# Patient Record
Sex: Male | Born: 1960 | Race: White | Hispanic: No | Marital: Married | Smoking: Never smoker
Health system: Southern US, Community
[De-identification: ages and names within clinical notes are randomized; demographics above are authoritative.]

## PROBLEM LIST (undated history)

## (undated) DIAGNOSIS — N393 Stress incontinence (female) (male): Secondary | ICD-10-CM

## (undated) DIAGNOSIS — R200 Anesthesia of skin: Secondary | ICD-10-CM

## (undated) DIAGNOSIS — I10 Essential (primary) hypertension: Secondary | ICD-10-CM

## (undated) DIAGNOSIS — E785 Hyperlipidemia, unspecified: Secondary | ICD-10-CM

## (undated) DIAGNOSIS — G479 Sleep disorder, unspecified: Secondary | ICD-10-CM

## (undated) DIAGNOSIS — M21379 Foot drop, unspecified foot: Secondary | ICD-10-CM

## (undated) DIAGNOSIS — G709 Myoneural disorder, unspecified: Secondary | ICD-10-CM

## (undated) DIAGNOSIS — R2689 Other abnormalities of gait and mobility: Secondary | ICD-10-CM

## (undated) HISTORY — PX: TIBIA FRACTURE SURGERY: SHX806

## (undated) HISTORY — DX: Essential (primary) hypertension: I10

## (undated) HISTORY — PX: VENA CAVA FILTER PLACEMENT: SUR1032

## (undated) HISTORY — DX: Hyperlipidemia, unspecified: E78.5

## (undated) HISTORY — PX: SPINE SURGERY: SHX786

## (undated) HISTORY — PX: COLONOSCOPY: SHX174

---

## 1999-02-18 ENCOUNTER — Ambulatory Visit (HOSPITAL_COMMUNITY): Admission: RE | Admit: 1999-02-18 | Discharge: 1999-02-18 | Payer: Self-pay | Admitting: *Deleted

## 1999-02-18 ENCOUNTER — Encounter: Payer: Self-pay | Admitting: *Deleted

## 2010-02-01 ENCOUNTER — Inpatient Hospital Stay (HOSPITAL_COMMUNITY)
Admission: AC | Admit: 2010-02-01 | Discharge: 2010-02-05 | Disposition: A | Discharge status: Rehabilitation Facility | Payer: Self-pay | Source: Home / Self Care

## 2010-02-05 ENCOUNTER — Inpatient Hospital Stay (HOSPITAL_COMMUNITY)
Admission: RE | Admit: 2010-02-05 | Discharge: 2010-02-20 | Payer: Self-pay | Attending: Physical Medicine & Rehabilitation | Admitting: Physical Medicine & Rehabilitation

## 2010-02-28 ENCOUNTER — Encounter
Admission: RE | Admit: 2010-02-28 | Discharge: 2010-03-20 | Payer: Self-pay | Source: Home / Self Care | Attending: Physical Medicine & Rehabilitation | Admitting: Physical Medicine & Rehabilitation

## 2010-03-09 NOTE — Discharge Summary (Signed)
Albert Nunez, Albert Nunez NO.:  [id number]  MEDICAL RECORD NO.:  [id number]          Nunez TYPE:  IPS  LOCATION:  4007                         FACILITY:  [facility]  PHYSICIAN:  Ranelle Oyster, M.D.DATE OF BIRTH:  [birth year]  DATE OF ADMISSION:  02/05/2010 DATE OF DISCHARGE:  02/20/2010                              DISCHARGE SUMMARY   DISCHARGE DIAGNOSES: 1. Cauda equina syndrome due to spinal cord injury with severe L1     fracture and anterolisthesis of T12 and L1 with canal compromise. 2. Hypotension. 3. Urinary retention, resolved.  HISTORY OF PRESENT ILLNESS:  Albert Nunez is a 49 year old male who fell approximately one-story while working at work on February 01, 2010, with brief loss of consciousness and amnesia of events.  Albert Nunez with complaints of back pain and tingling of toes.  Workup revealed mild compression fracture at T12, severe L1 fracture with anterolisthesis of T12 and L1 with canal compromise and disruption of posterior elements with widening between T12 and L1.  Albert Nunez underwent T12-L1 decompression with evacuation of epidural hematoma as well as T12-L1 open reduction with arthrodesis T9-L3 on Albert same day by Dr. Jordan Likes.  An IVC filter was placed for DVT prophylaxis on February 04, 2010.  Albert Nunez has had issues with hypertension as well as bradycardia with activity.  CT of chest on February 03, 2010, showed no evidence of PE and partial collapse bilateral lower lobes due to atelectasis.  Hemovac was discontinued on January 25, 2010.  Therapies were initiated and Albert Nunez with increase in left lower extremity instability with standing. Albert Nunez was evaluated by rehab and felt that she would benefit from a CIR program.  PAST MEDICAL HISTORY:  Significant for left tibial rodding for fracture 15 years ago as well as hypertension.  REVIEW OF SYMPTOMS:  Positive for wound care issues, weakness, numbness in bilateral lower  extremities.  ALLERGIES:  No known drug allergies.  FAMILY HISTORY:  Positive for cancer.  SOCIAL HISTORY:  Albert Nunez is married, lives independent, and self employed prior to admission.  Lives in two-level home with 3-4 steps at entry.  Bedroom on first level.  12-13 steps to second level.  Does not use any tobacco or alcohol.  Two small children at home.  Wife is an Chief Financial Officer stewardess and multiple supportive family members to assist past discharge.  FUNCTIONAL HISTORY:  Albert Nunez was independent and driving prior to admission.  FUNCTIONAL STATUS:  Albert Nunez is plus 2 total assist 50% to pivot to chair, has been able to stand 1 minute at +2 total assist 70%.  Albert Nunez is min assist for upper body care, total assist for lower body care.  PHYSICAL EXAMINATION:  VITALS:  Blood pressure 122/73, pulse 61, respirations 21, temperature 97.8. GENERAL:  Albert Nunez is well-nourished, well-developed male, alert and oriented x3 in no acute distress. HEENT:  Eyes anicteric, noninjected.  Oral mucosa is pink and moist. Nares patent. NECK:  Supple without JVD or lymphadenopathy. LUNGS:  Clear to auscultation bilaterally with good respiratory effort. SKIN:  Back incision shows healing midline incision from thoracic to lumbar area.  ABDOMEN:  Soft, nontender with positive bowel sounds.  Steri-Strips in place. NEUROLOGIC:  Albert Nunez is alert and oriented x3.  Decreased sensation below Albert hips, but his body fashion.  Albert Nunez is able to feel right L5, decrease in left S1, decrease in right S1.  Albert Nunez also with decreased sensation in scrotal and penile area as well as in buttocks. Albert Nunez with decreased rectal tone.  Able to sense pressure.  Motor strength is 5/5 bilateral deltoids, biceps, grips.  Right hip flexion is 2-/5, knee extension is 3-/5, ankle dorsiflexion, plantar flexion at 3- /5.  On left side, hip flexion is 2-/5, knee extension 3-/5, ankle dorsiflexion, plantar  flexion 0/5.   HOSPITAL COURSE:  Albert Nunez was admitted to rehab on February 05, 2010, for inpatient therapies to consist of PT, OT at least 3 hours 5 days a week.  Past admission physiatrist, rehab RN, and therapy team have worked together to provide customized collaborative interdisciplinary care.  His Foley had been discontinued prior to admission.  Voiding was monitored with PVR checks.  Albert Nunez was noted to have issues with urinary retention and no voiding attempts with bladder volumes at 300-700 mL.  Due to his neurogenic bladder, Foley was replaced.  Albert Nunez was also noted to have issues with constipation. Albert Nunez was started on bowel program by nursing.  KUB was done on February 09, 2010, showing constipation with feces throughout Albert colon. Extensive laxatives were used initially to help with initiation of bowel program.  Albert Nunez also with issues regarding orthostasis with attempts on therapy at 12:20 a.m.  An abdominal binder as well as thigh- high TEDs were ordered when out of bed.  By Albert time of discharge, blood pressures have stabilized out and Albert Nunez without need of binder.Apparently knee-high TEDs are being used to help with some edema control in his lower extremities.  Labs were done past admission showing Albert Nunez with acute blood loss anemia with H and H at 10.8 and 31.9, white count 8.3, platelets 210.  Check of lytes revealed sodium 140, potassium 4.1, chloride 105, CO2 of 27, BUN 22, creatinine 0.75, glucose 116.  Albert Nunez continued to have issues with urinary retention.  Albert Nunez was started on Flomax 0.4 mg at bedtime.  Low-dose Urecholine was added and increased to 25 mg p.o. t.i.d.  With Albert increase in Albert Urecholine dose, Albert Nunez was able to start voiding without evidence of retention.  Albert Nunez is being toileted q.4 h. and as of February 14, 2010, Albert Nunez is voiding with PVRs at 0 mL.  Initially Albert Nunez required Vicodin for  pain management.  Due to issues with severe constipation, Albert Nunez has backed off on this and p.r.n. Ultram has been effective currently.  Bowel program was initiated and has been effective with good results.  Currently, Albert Nunez is requiring suppository with dig stem and Albert Nunez as well as wife are able to do this without difficulty. Labs done past admission reveal hemoglobin 10.8, hematocrit 31.9, WBC 8.3, platlets 210.  Check of lytes revealed sodium 140, potassium 4.1, chloride 105, CO2 27, glucsoe 116, BUN 22, creatinine 0.95.  Liver function tests revealed AST 39, ALT 39, alkaline phos 34, total bilirubin 0.6, albumin 2.8.   During Albert Nunez's stay in rehab, weekly team conferences were held to monitor Albert Nunez's progress, set goals, as well as discuss barriers to discharge.  Extensive education has been done with Albert Nunez and wife regarding bowel  and bladder program and need to have it scheduled to help with voiding as well as to prevent constipation issues.  At admission, Albert Nunez was noted to have decreased tolerance to being upright due to hypertensive episodes.  Albert Nunez was also noted to have decrease in sitting and standing balance as well as lower extremity weakness with decreased sensation and proprioception throughout his lower extremities.  Albert Nunez required mod to max assist for bed mobility and for sitting at Albert edge of Albert bed.  Albert Nunez was able to perform sit-to-stand transfer as mod-to-max assist and able to stand for 45 seconds.  Albert Nunez could propel wheelchair with upper extremities with supervision for 120 feet.  Albert Nunez has had improvement in lower extremity strength.  Albert Nunez continues with left AFO and a solid upright AFO was ordered for left lower extremity.  Currently, Albert Nunez is at supervision level for bed to chair transfers, car transfers, as well as wheelchair transfers.  Albert Nunez was able to propel his wheelchair independently.  Albert Nunez has been able to ambulate up to  160 feet with min assist over low obstacles.  Albert Nunez is able to navigate 4 steps with use of bilateral rails with min assist as well as vocational close supervision for steadying.  OT has worked with Albert Nunez on self-care tasks. Initially Albert Nunez at total assist for BADLs.  Wife as well as main caregiver have been present during most therapy sessions.  Albert Nunez has worked extensively to help improve with activity tolerance as well as upper extremity strengthening.  Albert Nunez to continue using Adaptic equipment for lower extremities to help improve independence in self- care tasks.  Currently, Albert Nunez is min assist for bathing due to requiring assist for perineal care while adhering to back precautions. Albert Nunez is set up with supervision for upper body dressing due to his inability to consistently adhere to back precautions for donning shirt. Albert Nunez is min assist for lower body dressing especially to don his AFO.  Albert Nunez also requires assist to don and tie right shoe.  Albert Nunez is to have assistance from wife and sister for his ADL tasks.  Further followup outpatient PT, OT have been set up at [facility] to begin on February 28, 2010. Education has been done with family regarding current  progress, anticipated recovery and long term needs. On February 20, 2010, Albert Nunez is discharged to home.  DISCHARGE MEDICATIONS: 1. Urecholine 25 mg p.o. t.i.d. at 7 a.m., 11:30, and 5:30 p.m. 2. Dulcolax suppository one per rectum q.a.m. 3. Tylenol 325/650 mg p.o. q.4 h. p.r.n. pain. 4. Os-Cal plus D p.o. t.i.d. 5. Robaxin 500 mg p.o. q.6 h. p.r.n. spasm. 6. Naprosyn 250 mg p.o. q.12 h. p.r.n. pain. 7. Senokot-S two p.o. at noon as well as at bedtime. 8. Ultram 50 mg p.o. q.6 h. p.r.n. moderate-to-severe pain. 9. Fenofibrate 160 mg p.o. per day.  DIET:  Regular.  ACTIVITY LEVEL:  A 24-hour supervision for mobility.  No strenuous activity.  SPECIAL INSTRUCTIONS:  No alcohol, no  smoking, and no driving.  Weight TLSO at edge of bed when out of bed.  [facility] outpatient rehab to include PT/OT to begin February 28, 2009, at 8:30 to 10:30 a.m.  Do not use amlodipine for now.  FOLLOWUP:  Albert Nunez to follow up with Dr. Riley Kill on March 23, 2010, at 12 noon.  Follow up with Dr. Julio Sicks in 2-3 weeks for postop check.  Follow up with Dr. Onalee Hua  Swayne for routine check in 4 weeks.     Delle Reining, P.A.   ______________________________ Ranelle Oyster, M.D.    PL/MEDQ  D:  02/20/2010  T:  02/21/2010  Job:  [id number]  cc:   Henry A. Pool, M.D. Tally Joe, M.D.  Electronically Signed by Osvaldo Shipper. on 02/21/2010 03:23:17 PM Electronically Signed by Faith Rogue M.D. on 03/09/2010 09:53:21 AM

## 2010-03-14 ENCOUNTER — Encounter
Admission: RE | Admit: 2010-03-14 | Discharge: 2010-03-14 | Payer: Self-pay | Source: Home / Self Care | Attending: Neurosurgery | Admitting: Neurosurgery

## 2010-03-22 ENCOUNTER — Ambulatory Visit: Payer: BC Managed Care – HMO | Attending: Orthopedic Surgery | Admitting: Physical Therapy

## 2010-03-22 ENCOUNTER — Ambulatory Visit: Payer: BC Managed Care – HMO | Admitting: Occupational Therapy

## 2010-03-22 DIAGNOSIS — G834 Cauda equina syndrome: Secondary | ICD-10-CM | POA: Insufficient documentation

## 2010-03-22 DIAGNOSIS — Z5189 Encounter for other specified aftercare: Secondary | ICD-10-CM | POA: Insufficient documentation

## 2010-03-22 DIAGNOSIS — R269 Unspecified abnormalities of gait and mobility: Secondary | ICD-10-CM | POA: Insufficient documentation

## 2010-03-22 DIAGNOSIS — M6281 Muscle weakness (generalized): Secondary | ICD-10-CM | POA: Insufficient documentation

## 2010-03-22 DIAGNOSIS — R5381 Other malaise: Secondary | ICD-10-CM | POA: Insufficient documentation

## 2010-03-23 ENCOUNTER — Ambulatory Visit: Payer: BC Managed Care – HMO | Attending: Physical Medicine & Rehabilitation

## 2010-03-23 ENCOUNTER — Ambulatory Visit: Admit: 2010-03-23 | Payer: Self-pay | Admitting: Physical Medicine & Rehabilitation

## 2010-03-23 ENCOUNTER — Inpatient Hospital Stay (HOSPITAL_BASED_OUTPATIENT_CLINIC_OR_DEPARTMENT_OTHER): Payer: BC Managed Care – HMO | Admitting: Physical Medicine & Rehabilitation

## 2010-03-23 ENCOUNTER — Ambulatory Visit: Payer: BC Managed Care – HMO

## 2010-03-23 DIAGNOSIS — Q762 Congenital spondylolisthesis: Secondary | ICD-10-CM | POA: Insufficient documentation

## 2010-03-23 DIAGNOSIS — IMO0002 Reserved for concepts with insufficient information to code with codable children: Secondary | ICD-10-CM | POA: Insufficient documentation

## 2010-03-23 DIAGNOSIS — X58XXXS Exposure to other specified factors, sequela: Secondary | ICD-10-CM | POA: Insufficient documentation

## 2010-03-23 DIAGNOSIS — S343XXA Injury of cauda equina, initial encounter: Secondary | ICD-10-CM

## 2010-03-23 DIAGNOSIS — G834 Cauda equina syndrome: Secondary | ICD-10-CM | POA: Insufficient documentation

## 2010-03-23 DIAGNOSIS — N319 Neuromuscular dysfunction of bladder, unspecified: Secondary | ICD-10-CM

## 2010-03-23 DIAGNOSIS — K592 Neurogenic bowel, not elsewhere classified: Secondary | ICD-10-CM

## 2010-03-26 ENCOUNTER — Ambulatory Visit: Payer: BC Managed Care – HMO | Admitting: Physical Therapy

## 2010-03-26 ENCOUNTER — Encounter: Payer: Self-pay | Admitting: Occupational Therapy

## 2010-03-29 ENCOUNTER — Ambulatory Visit: Payer: BC Managed Care – HMO | Admitting: Physical Therapy

## 2010-03-29 ENCOUNTER — Encounter: Payer: Self-pay | Admitting: Occupational Therapy

## 2010-04-02 ENCOUNTER — Ambulatory Visit: Payer: BC Managed Care – HMO | Admitting: Physical Therapy

## 2010-04-02 ENCOUNTER — Encounter: Payer: Self-pay | Admitting: Occupational Therapy

## 2010-04-03 ENCOUNTER — Other Ambulatory Visit: Payer: Self-pay | Admitting: Physical Medicine & Rehabilitation

## 2010-04-03 DIAGNOSIS — O2230 Deep phlebothrombosis in pregnancy, unspecified trimester: Secondary | ICD-10-CM

## 2010-04-04 ENCOUNTER — Encounter: Payer: Self-pay | Admitting: Occupational Therapy

## 2010-04-04 ENCOUNTER — Ambulatory Visit: Payer: BC Managed Care – HMO | Admitting: Physical Therapy

## 2010-04-09 ENCOUNTER — Ambulatory Visit: Payer: Self-pay | Admitting: Physical Therapy

## 2010-04-09 ENCOUNTER — Encounter: Payer: Self-pay | Admitting: Occupational Therapy

## 2010-04-09 ENCOUNTER — Ambulatory Visit: Payer: BC Managed Care – HMO | Admitting: Physical Therapy

## 2010-04-11 ENCOUNTER — Encounter: Payer: Self-pay | Admitting: Occupational Therapy

## 2010-04-11 ENCOUNTER — Ambulatory Visit: Payer: BC Managed Care – HMO | Admitting: Physical Therapy

## 2010-04-13 ENCOUNTER — Ambulatory Visit (HOSPITAL_COMMUNITY)
Admission: RE | Admit: 2010-04-13 | Discharge: 2010-04-13 | Disposition: A | Discharge status: Home / Self Care | Payer: BC Managed Care – HMO | Source: Ambulatory Visit | Attending: Physical Medicine & Rehabilitation | Admitting: Physical Medicine & Rehabilitation

## 2010-04-13 ENCOUNTER — Other Ambulatory Visit: Payer: Self-pay | Admitting: Physical Medicine & Rehabilitation

## 2010-04-13 DIAGNOSIS — X58XXXA Exposure to other specified factors, initial encounter: Secondary | ICD-10-CM | POA: Insufficient documentation

## 2010-04-13 DIAGNOSIS — O2230 Deep phlebothrombosis in pregnancy, unspecified trimester: Secondary | ICD-10-CM

## 2010-04-13 DIAGNOSIS — IMO0002 Reserved for concepts with insufficient information to code with codable children: Secondary | ICD-10-CM | POA: Insufficient documentation

## 2010-04-13 LAB — CBC
Hemoglobin: 12.4 g/dL — ABNORMAL LOW (ref 13.0–17.0)
MCH: 29.7 pg (ref 26.0–34.0)
MCV: 84.4 fL (ref 78.0–100.0)
RBC: 4.18 MIL/uL — ABNORMAL LOW (ref 4.22–5.81)

## 2010-04-13 LAB — POCT I-STAT, CHEM 8
BUN: 16 mg/dL (ref 6–23)
Creatinine, Ser: 0.9 mg/dL (ref 0.4–1.5)
Potassium: 4 mEq/L (ref 3.5–5.1)
Sodium: 142 mEq/L (ref 135–145)

## 2010-04-13 MED ORDER — IOHEXOL 300 MG/ML  SOLN
100.0000 mL | Freq: Once | INTRAMUSCULAR | Status: AC | PRN
  Administered 2010-04-13: 70 mL via INTRAVENOUS

## 2010-04-13 MED ORDER — IOHEXOL 300 MG/ML  SOLN: 150.0000 mL | Freq: Once | INTRAMUSCULAR | Status: AC | PRN

## 2010-04-16 ENCOUNTER — Ambulatory Visit: Payer: BC Managed Care – HMO | Admitting: Physical Therapy

## 2010-04-16 ENCOUNTER — Encounter: Payer: Self-pay | Admitting: Occupational Therapy

## 2010-04-18 ENCOUNTER — Encounter: Payer: Self-pay | Admitting: Occupational Therapy

## 2010-04-18 ENCOUNTER — Ambulatory Visit: Payer: BC Managed Care – HMO | Admitting: Physical Therapy

## 2010-04-24 ENCOUNTER — Ambulatory Visit: Payer: BC Managed Care – HMO | Attending: Orthopedic Surgery | Admitting: Physical Therapy

## 2010-04-24 DIAGNOSIS — R269 Unspecified abnormalities of gait and mobility: Secondary | ICD-10-CM | POA: Insufficient documentation

## 2010-04-24 DIAGNOSIS — G834 Cauda equina syndrome: Secondary | ICD-10-CM | POA: Insufficient documentation

## 2010-04-24 DIAGNOSIS — M6281 Muscle weakness (generalized): Secondary | ICD-10-CM | POA: Insufficient documentation

## 2010-04-24 DIAGNOSIS — Z5189 Encounter for other specified aftercare: Secondary | ICD-10-CM | POA: Insufficient documentation

## 2010-04-24 DIAGNOSIS — R5381 Other malaise: Secondary | ICD-10-CM | POA: Insufficient documentation

## 2010-04-25 ENCOUNTER — Ambulatory Visit
Admission: RE | Admit: 2010-04-25 | Discharge: 2010-04-25 | Disposition: A | Discharge status: Home / Self Care | Payer: BC Managed Care – HMO | Source: Ambulatory Visit | Attending: Neurosurgery | Admitting: Neurosurgery

## 2010-04-25 ENCOUNTER — Other Ambulatory Visit: Payer: Self-pay | Admitting: Neurosurgery

## 2010-04-25 DIAGNOSIS — T148XXA Other injury of unspecified body region, initial encounter: Secondary | ICD-10-CM

## 2010-04-26 ENCOUNTER — Ambulatory Visit: Payer: BC Managed Care – HMO | Admitting: Physical Therapy

## 2010-04-27 ENCOUNTER — Ambulatory Visit (HOSPITAL_BASED_OUTPATIENT_CLINIC_OR_DEPARTMENT_OTHER): Payer: BC Managed Care – HMO | Admitting: Physical Medicine & Rehabilitation

## 2010-04-27 ENCOUNTER — Encounter: Payer: BC Managed Care – HMO | Attending: Physical Medicine & Rehabilitation

## 2010-04-27 DIAGNOSIS — G834 Cauda equina syndrome: Secondary | ICD-10-CM | POA: Insufficient documentation

## 2010-04-27 DIAGNOSIS — K592 Neurogenic bowel, not elsewhere classified: Secondary | ICD-10-CM

## 2010-04-27 DIAGNOSIS — N319 Neuromuscular dysfunction of bladder, unspecified: Secondary | ICD-10-CM

## 2010-04-27 DIAGNOSIS — R29898 Other symptoms and signs involving the musculoskeletal system: Secondary | ICD-10-CM | POA: Insufficient documentation

## 2010-04-27 DIAGNOSIS — S34109A Unspecified injury to unspecified level of lumbar spinal cord, initial encounter: Secondary | ICD-10-CM

## 2010-04-27 DIAGNOSIS — S32008A Other fracture of unspecified lumbar vertebra, initial encounter for closed fracture: Secondary | ICD-10-CM

## 2010-04-30 ENCOUNTER — Ambulatory Visit: Payer: BC Managed Care – HMO | Admitting: Physical Therapy

## 2010-04-30 LAB — POCT I-STAT, CHEM 8
Calcium, Ion: 1.04 mmol/L — ABNORMAL LOW (ref 1.12–1.32)
Glucose, Bld: 112 mg/dL — ABNORMAL HIGH (ref 70–99)
HCT: 38 % — ABNORMAL LOW (ref 39.0–52.0)
Hemoglobin: 12.9 g/dL — ABNORMAL LOW (ref 13.0–17.0)
TCO2: 21 mmol/L (ref 0–100)

## 2010-04-30 LAB — COMPREHENSIVE METABOLIC PANEL
ALT: 38 U/L (ref 0–53)
Albumin: 2.8 g/dL — ABNORMAL LOW (ref 3.5–5.2)
Alkaline Phosphatase: 46 U/L (ref 39–117)
BUN: 20 mg/dL (ref 6–23)
CO2: 24 meq/L (ref 19–32)
Calcium: 8.5 mg/dL (ref 8.4–10.5)
Chloride: 107 meq/L (ref 96–112)
GFR calc non Af Amer: >60 mL/min (ref >60)
Glucose, Bld: 116 mg/dL — ABNORMAL HIGH (ref 70–99)
Glucose, Bld: 116 mg/dL — ABNORMAL HIGH (ref 70–99)
Potassium: 3.4 meq/L — ABNORMAL LOW (ref 3.5–5.1)
Potassium: 4.1 mEq/L (ref 3.5–5.1)
Sodium: 140 mEq/L (ref 135–145)
Total Bilirubin: 0.5 mg/dL (ref 0.3–1.2)
Total Protein: 5.7 g/dL — ABNORMAL LOW (ref 6.0–8.3)
Total Protein: 7.1 g/dL (ref 6.0–8.3)

## 2010-04-30 LAB — BASIC METABOLIC PANEL
BUN: 17 mg/dL (ref 6–23)
CO2: 23 mEq/L (ref 19–32)
Chloride: 107 mEq/L (ref 96–112)
Chloride: 113 mEq/L — ABNORMAL HIGH (ref 96–112)
GFR calc Af Amer: >60 mL/min (ref >60)
GFR calc non Af Amer: >60 mL/min (ref >60)
Potassium: 4.2 mEq/L (ref 3.5–5.1)
Sodium: 136 mEq/L (ref 135–145)
Sodium: 140 mEq/L (ref 135–145)

## 2010-04-30 LAB — CBC
HCT: 31.9 % — ABNORMAL LOW (ref 39.0–52.0)
HCT: 33 % — ABNORMAL LOW (ref 39.0–52.0)
HCT: 37.3 % — ABNORMAL LOW (ref 39.0–52.0)
Hemoglobin: 10.5 g/dL — ABNORMAL LOW (ref 13.0–17.0)
Hemoglobin: 11.2 g/dL — ABNORMAL LOW (ref 13.0–17.0)
MCH: 29.5 pg (ref 26.0–34.0)
MCHC: 33.9 g/dL (ref 30.0–36.0)
MCV: 85.7 fL (ref 78.0–100.0)
MCV: 85.7 fL (ref 78.0–100.0)
MCV: 86.5 fL (ref 78.0–100.0)
Platelets: 210 10*3/uL (ref 150–400)
RBC: 3.56 MIL/uL — ABNORMAL LOW (ref 4.22–5.81)
RDW: 12.1 % (ref 11.5–15.5)
RDW: 12.2 % (ref 11.5–15.5)
RDW: 14.4 % (ref 11.5–15.5)
WBC: 3.2 10*3/uL — ABNORMAL LOW (ref 4.0–10.5)
WBC: 7.3 10*3/uL (ref 4.0–10.5)
WBC: 8.3 10*3/uL (ref 4.0–10.5)
WBC: 8.3 10*3/uL (ref 4.0–10.5)

## 2010-04-30 LAB — CULTURE, BLOOD (ROUTINE X 2)
Culture  Setup Time: 201112261955
Culture: NO GROWTH

## 2010-04-30 LAB — DIFFERENTIAL
Lymphs Abs: 1.1 10*3/uL (ref 0.7–4.0)
Monocytes Absolute: 1.1 10*3/uL — ABNORMAL HIGH (ref 0.1–1.0)
Monocytes Relative: 13 % — ABNORMAL HIGH (ref 3–12)
Neutro Abs: 6 10*3/uL (ref 1.7–7.7)
Neutrophils Relative %: 72 % (ref 43–77)

## 2010-04-30 LAB — TYPE AND SCREEN
ABO/RH(D): O POS
Antibody Screen: NEGATIVE
Unit division: 0

## 2010-04-30 LAB — GLUCOSE, CAPILLARY

## 2010-04-30 LAB — CARDIAC PANEL(CRET KIN+CKTOT+MB+TROPI)
CK, MB: 2.7 ng/mL (ref 0.3–4.0)
Relative Index: 0.3 (ref 0.0–2.5)
Total CK: 1048 U/L — ABNORMAL HIGH (ref 7–232)
Troponin I: 0.01 ng/mL (ref 0.00–0.06)

## 2010-04-30 LAB — URINE CULTURE
Colony Count: NO GROWTH
Culture  Setup Time: 201112200410
Culture: NO GROWTH

## 2010-04-30 LAB — URINALYSIS, ROUTINE W REFLEX MICROSCOPIC
Bilirubin Urine: NEGATIVE
Ketones, ur: NEGATIVE mg/dL
Leukocytes, UA: NEGATIVE
Nitrite: NEGATIVE
Protein, ur: NEGATIVE mg/dL
Urobilinogen, UA: 0.2 mg/dL (ref 0.0–1.0)
pH: 6 (ref 5.0–8.0)

## 2010-04-30 LAB — URINE MICROSCOPIC-ADD ON

## 2010-04-30 LAB — SAMPLE TO BLOOD BANK

## 2010-04-30 LAB — PROTIME-INR
INR: 1.08 (ref 0.00–1.49)
Prothrombin Time: 14.2 s (ref 11.6–15.2)

## 2010-04-30 LAB — MRSA PCR SCREENING: MRSA by PCR: NEGATIVE

## 2010-05-04 ENCOUNTER — Ambulatory Visit: Payer: BC Managed Care – HMO | Admitting: Physical Therapy

## 2010-05-07 ENCOUNTER — Ambulatory Visit: Payer: BC Managed Care – HMO | Admitting: Physical Therapy

## 2010-05-09 ENCOUNTER — Ambulatory Visit: Payer: BC Managed Care – HMO | Admitting: Physical Therapy

## 2010-05-14 ENCOUNTER — Ambulatory Visit: Payer: BC Managed Care – HMO | Admitting: Physical Therapy

## 2010-05-16 ENCOUNTER — Ambulatory Visit: Payer: BC Managed Care – HMO | Admitting: Physical Therapy

## 2010-05-21 ENCOUNTER — Ambulatory Visit: Payer: BC Managed Care – HMO | Attending: Orthopedic Surgery | Admitting: Physical Therapy

## 2010-05-21 DIAGNOSIS — Z5189 Encounter for other specified aftercare: Secondary | ICD-10-CM | POA: Insufficient documentation

## 2010-05-21 DIAGNOSIS — M6281 Muscle weakness (generalized): Secondary | ICD-10-CM | POA: Insufficient documentation

## 2010-05-21 DIAGNOSIS — R269 Unspecified abnormalities of gait and mobility: Secondary | ICD-10-CM | POA: Insufficient documentation

## 2010-05-21 DIAGNOSIS — R5381 Other malaise: Secondary | ICD-10-CM | POA: Insufficient documentation

## 2010-05-21 DIAGNOSIS — G834 Cauda equina syndrome: Secondary | ICD-10-CM | POA: Insufficient documentation

## 2010-05-23 ENCOUNTER — Ambulatory Visit: Payer: BC Managed Care – HMO | Admitting: Physical Therapy

## 2010-05-28 ENCOUNTER — Ambulatory Visit: Payer: BC Managed Care – HMO | Admitting: Physical Therapy

## 2010-05-29 ENCOUNTER — Ambulatory Visit: Payer: BC Managed Care – HMO | Admitting: Physical Therapy

## 2010-06-05 ENCOUNTER — Ambulatory Visit: Payer: BC Managed Care – HMO | Admitting: Physical Therapy

## 2010-06-08 ENCOUNTER — Ambulatory Visit: Payer: BC Managed Care – HMO | Admitting: Physical Therapy

## 2010-06-12 ENCOUNTER — Ambulatory Visit: Payer: BC Managed Care – HMO | Admitting: Physical Therapy

## 2010-06-14 ENCOUNTER — Ambulatory Visit: Payer: BC Managed Care – HMO | Admitting: Physical Therapy

## 2010-06-19 ENCOUNTER — Ambulatory Visit: Payer: BC Managed Care – HMO | Attending: Orthopedic Surgery | Admitting: Physical Therapy

## 2010-06-19 DIAGNOSIS — Z5189 Encounter for other specified aftercare: Secondary | ICD-10-CM | POA: Insufficient documentation

## 2010-06-19 DIAGNOSIS — R269 Unspecified abnormalities of gait and mobility: Secondary | ICD-10-CM | POA: Insufficient documentation

## 2010-06-19 DIAGNOSIS — G834 Cauda equina syndrome: Secondary | ICD-10-CM | POA: Insufficient documentation

## 2010-06-19 DIAGNOSIS — R5381 Other malaise: Secondary | ICD-10-CM | POA: Insufficient documentation

## 2010-06-19 DIAGNOSIS — M6281 Muscle weakness (generalized): Secondary | ICD-10-CM | POA: Insufficient documentation

## 2010-06-21 ENCOUNTER — Ambulatory Visit: Payer: BC Managed Care – HMO | Admitting: Physical Therapy

## 2010-06-26 ENCOUNTER — Ambulatory Visit: Payer: BC Managed Care – HMO | Admitting: Physical Therapy

## 2010-07-03 ENCOUNTER — Ambulatory Visit: Payer: BC Managed Care – HMO | Admitting: Physical Therapy

## 2010-07-10 ENCOUNTER — Ambulatory Visit: Payer: BC Managed Care – HMO | Admitting: Physical Therapy

## 2010-07-12 ENCOUNTER — Ambulatory Visit: Payer: BC Managed Care – HMO | Admitting: Physical Therapy

## 2010-07-17 ENCOUNTER — Ambulatory Visit: Payer: BC Managed Care – HMO | Admitting: Physical Therapy

## 2010-07-30 ENCOUNTER — Encounter
Payer: BC Managed Care – HMO | Attending: Physical Medicine & Rehabilitation | Admitting: Physical Medicine & Rehabilitation

## 2010-07-30 DIAGNOSIS — G834 Cauda equina syndrome: Secondary | ICD-10-CM | POA: Insufficient documentation

## 2010-07-30 DIAGNOSIS — N319 Neuromuscular dysfunction of bladder, unspecified: Secondary | ICD-10-CM

## 2010-07-30 DIAGNOSIS — R32 Unspecified urinary incontinence: Secondary | ICD-10-CM | POA: Insufficient documentation

## 2010-07-30 DIAGNOSIS — K592 Neurogenic bowel, not elsewhere classified: Secondary | ICD-10-CM

## 2010-07-30 DIAGNOSIS — S32008A Other fracture of unspecified lumbar vertebra, initial encounter for closed fracture: Secondary | ICD-10-CM

## 2010-07-30 DIAGNOSIS — S34109A Unspecified injury to unspecified level of lumbar spinal cord, initial encounter: Secondary | ICD-10-CM

## 2010-07-31 NOTE — Assessment & Plan Note (Signed)
Albert Nunez is back regarding his cauda equina syndrome.  He has been walking with his left AFO.  He notes some improvement in his ankle movement.  He does not like the AFO as it is very hot this summer and he is constantly sweating against it.  He denies pain.  He has had some urinary incontinence at night especially when he stresses or Valsalvas.  REVIEW OF SYSTEMS:  Notable for the above.  He is moving his bowels regularly.  Full review is in the written health and history section of the chart.  SOCIAL HISTORY:  Unchanged.  He is married living with family.  He remains active caring for his kids.  He has not returned to work.  PHYSICAL EXAMINATION:  VITAL SIGNS:  Blood pressure is 142/80, pulse 55, respiratory rate 18, he is satting 98% on room air. GENERAL:  The patient is pleasant, alert and oriented x3.  His affect is bright and appropriate. EXTREMITIES:  He walked for me today and he is walking better with the brace, but still this as a bit of itch to his gait.  He has trace ankle dorsiflexion now.  No ankle plantarflexion seen.  Knee extension and flexion are 4/5 and the hip flexion is 4/5 as well.  Sensation is diminished still in the legs.  Sitting posture is excellent as well as standing posture today.  ASSESSMENT: 1. 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 2. Urinary incontinence/neurogenic/ hyperactive bladder. 3. Neurogenic bowel.  PLAN: 1. We will send the patient to advanced orthotics for a blue rocker     orthosis.  This will give him more freedom from a standpoint of     dynamic movement and also be cooler for him. 2. We will start Detrol 2 mg q.a.m. for bladder incontinence.  We will     continue titrating as needed.  If we fail with this, I will likely     send him to see Urology. 3. I will see Albert Nunez back on a scheduled basis in 3 months.  I filled out     insurance paperwork for him today.     Ranelle Oyster, M.D. Electronically  Signed    ZTS/MedQ D:  07/30/2010 12:13:57  T:  07/31/2010 01:45:00  Job #:  [id number]  cc:   Sherilyn Cooter A. Pool, M.D. Fax: [phone]

## 2010-10-05 ENCOUNTER — Emergency Department (HOSPITAL_COMMUNITY)
Admission: EM | Admit: 2010-10-05 | Discharge: 2010-10-05 | Disposition: A | Discharge status: Home / Self Care | Payer: BC Managed Care – HMO | Attending: Emergency Medicine | Admitting: Emergency Medicine

## 2010-10-05 DIAGNOSIS — R109 Unspecified abdominal pain: Secondary | ICD-10-CM | POA: Insufficient documentation

## 2010-10-05 DIAGNOSIS — N390 Urinary tract infection, site not specified: Secondary | ICD-10-CM | POA: Insufficient documentation

## 2010-10-05 DIAGNOSIS — Z79899 Other long term (current) drug therapy: Secondary | ICD-10-CM | POA: Insufficient documentation

## 2010-10-05 DIAGNOSIS — R10819 Abdominal tenderness, unspecified site: Secondary | ICD-10-CM | POA: Insufficient documentation

## 2010-10-05 LAB — DIFFERENTIAL
Basophils Absolute: 0 10*3/uL (ref 0.0–0.1)
Basophils Relative: 0 % (ref 0–1)
Eosinophils Absolute: 0 K/uL (ref 0.0–0.7)
Eosinophils Relative: 0 % (ref 0–5)
Lymphocytes Relative: 7 % — ABNORMAL LOW (ref 12–46)
Lymphs Abs: 1.2 K/uL (ref 0.7–4.0)
Monocytes Absolute: 1.4 K/uL — ABNORMAL HIGH (ref 0.1–1.0)
Monocytes Relative: 9 % (ref 3–12)
Neutro Abs: 13.8 10*3/uL — ABNORMAL HIGH (ref 1.7–7.7)
Neutrophils Relative %: 84 % — ABNORMAL HIGH (ref 43–77)

## 2010-10-05 LAB — CBC
HCT: 37.8 % — ABNORMAL LOW (ref 39.0–52.0)
Hemoglobin: 13.6 g/dL (ref 13.0–17.0)
MCH: 30.8 pg (ref 26.0–34.0)
MCHC: 36 g/dL (ref 30.0–36.0)
MCV: 85.5 fL (ref 78.0–100.0)
Platelets: 211 K/uL (ref 150–400)
RBC: 4.42 MIL/uL (ref 4.22–5.81)
RDW: 12.7 % (ref 11.5–15.5)
WBC: 16.4 K/uL — ABNORMAL HIGH (ref 4.0–10.5)

## 2010-10-05 LAB — URINALYSIS, ROUTINE W REFLEX MICROSCOPIC
Glucose, UA: NEGATIVE mg/dL
Protein, ur: 100 mg/dL — AB
Urobilinogen, UA: 1 mg/dL (ref 0.0–1.0)

## 2010-10-05 LAB — URINE MICROSCOPIC-ADD ON

## 2010-10-05 LAB — COMPREHENSIVE METABOLIC PANEL
ALT: 27 U/L (ref 0–53)
Alkaline Phosphatase: 59 U/L (ref 39–117)
BUN: 20 mg/dL (ref 6–23)
CO2: 23 mEq/L (ref 19–32)
GFR calc Af Amer: >60 mL/min (ref >60)
GFR calc non Af Amer: >60 mL/min (ref >60)
Glucose, Bld: 120 mg/dL — ABNORMAL HIGH (ref 70–99)
Potassium: 3.9 mEq/L (ref 3.5–5.1)
Sodium: 134 mEq/L — ABNORMAL LOW (ref 135–145)
Total Bilirubin: 0.8 mg/dL (ref 0.3–1.2)

## 2010-10-05 LAB — LIPASE, BLOOD: Lipase: 12 U/L (ref 11–59)

## 2010-10-05 LAB — COMPREHENSIVE METABOLIC PANEL WITH GFR
AST: 20 U/L (ref 0–37)
Albumin: 4.3 g/dL (ref 3.5–5.2)
Calcium: 9.8 mg/dL (ref 8.4–10.5)
Chloride: 96 meq/L (ref 96–112)
Creatinine, Ser: 0.9 mg/dL (ref 0.50–1.35)
Total Protein: 7.9 g/dL (ref 6.0–8.3)

## 2010-10-08 LAB — URINE CULTURE
Colony Count: >=100000
Culture  Setup Time: 201208170939

## 2010-10-30 ENCOUNTER — Encounter
Payer: BC Managed Care – HMO | Attending: Physical Medicine & Rehabilitation | Admitting: Physical Medicine & Rehabilitation

## 2010-10-30 DIAGNOSIS — N319 Neuromuscular dysfunction of bladder, unspecified: Secondary | ICD-10-CM

## 2010-10-30 DIAGNOSIS — K592 Neurogenic bowel, not elsewhere classified: Secondary | ICD-10-CM

## 2010-10-30 DIAGNOSIS — IMO0002 Reserved for concepts with insufficient information to code with codable children: Secondary | ICD-10-CM | POA: Insufficient documentation

## 2010-10-30 DIAGNOSIS — G834 Cauda equina syndrome: Secondary | ICD-10-CM | POA: Insufficient documentation

## 2010-10-30 DIAGNOSIS — X58XXXA Exposure to other specified factors, initial encounter: Secondary | ICD-10-CM | POA: Insufficient documentation

## 2010-10-30 DIAGNOSIS — R32 Unspecified urinary incontinence: Secondary | ICD-10-CM | POA: Insufficient documentation

## 2010-10-30 DIAGNOSIS — S34109A Unspecified injury to unspecified level of lumbar spinal cord, initial encounter: Secondary | ICD-10-CM

## 2010-10-30 DIAGNOSIS — S32008A Other fracture of unspecified lumbar vertebra, initial encounter for closed fracture: Secondary | ICD-10-CM

## 2010-10-30 NOTE — Assessment & Plan Note (Signed)
HISTORY:  Albert Nunez is back regarding his cauda equina syndrome.  He is generally being doing very well.  He is walking with a cane, sometimes without using his AFO.  He is seeing, Albert Nunez and has had urodynamic testing done and does have some leak but no spasticity in his bladder.  He stopped the Detrol.  He did have recent UTI that send him back quite a bit.  For the most part, he has been doing pretty well, has no pain.  REVIEW OF SYSTEMS:  Notable for the above.  Full 12-point review is in the written health and history section of the chart.  SOCIAL HISTORY:  The patient is married.  He stays at home, helping rise the family.  PHYSICAL EXAMINATION:  VITAL SIGNS:  Blood pressure is 143/75, pulse 54, respiratory rate 16, and he is satting 98% on room air. GENERAL:  The patient is pleasant and alert.  He had diminished sensation over the left leg particularly over the plantar aspect of the foot.  He has some general pressure sense over the dorsum of the foot. He has perhaps trace ankle dorsiflexion and absent plantar flexion. Knee extension is 4/5, flexion is a bit less perhaps 4-/5.  Hip flexion is 4/5.  Hip extension is 4/5.  Hip abduction is 4+/5.  Hip abduction is 4/5.  Had a walk for me today.  He tends to have some issues still with position sense on the left foot while standing and stance phase.  He does make of this with control of his knee and hip.  He does tend to swing the leg out a bit with his hip and knee to help with clearance. Sitting posture is excellent.  ASSESSMENT: 1. 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 2. History of neurogenic bladder with incontinence and recent urinary     tract infection.  PLAN: 1. Could consider aquatic therapy trial to see if he can further     improve his lower extremity propioception and strength.  I told him     to continue focusing on proper technique and form.  I do think he     needs his AFO when  ambulating because he had little strength in the     ankle and his sensory function is still quite inhibited. 2. The patient will see me back here in about 6 months.  He will call     with me any problems or questions.  I did give him a permanent     handicap parking pass today.     Ranelle Oyster, M.D. Electronically Signed    ZTS/MedQ D:  10/30/2010 11:23:51  T:  10/30/2010 17:08:19  Job #:  [id number]  cc:   Albert Nunez, M.D. Fax: [phone]  Albert Nunez.

## 2011-04-29 ENCOUNTER — Encounter
Payer: BC Managed Care – HMO | Attending: Physical Medicine & Rehabilitation | Admitting: Physical Medicine & Rehabilitation

## 2011-04-29 ENCOUNTER — Encounter: Payer: Self-pay | Admitting: Physical Medicine & Rehabilitation

## 2011-04-29 DIAGNOSIS — N319 Neuromuscular dysfunction of bladder, unspecified: Secondary | ICD-10-CM | POA: Insufficient documentation

## 2011-04-29 DIAGNOSIS — IMO0002 Reserved for concepts with insufficient information to code with codable children: Secondary | ICD-10-CM | POA: Insufficient documentation

## 2011-04-29 DIAGNOSIS — M792 Neuralgia and neuritis, unspecified: Secondary | ICD-10-CM

## 2011-04-29 DIAGNOSIS — G834 Cauda equina syndrome: Secondary | ICD-10-CM

## 2011-04-29 MED ORDER — GABAPENTIN 100 MG PO CAPS: 100.0000 mg | ORAL_CAPSULE | Freq: Three times a day (TID) | ORAL | Status: DC | PRN

## 2011-04-29 NOTE — Patient Instructions (Signed)
Call me if you don't hear from therapy.  Try taking neurontin at night time only first (as needed)

## 2011-04-29 NOTE — Progress Notes (Signed)
  Subjective:    Patient ID: Albert Nunez, male    DOB: [birth year], 51 y.o.   MRN: [id number]  HPI  Overall doing fairly well.  Has noticed return in some of his ankle function. Stil requires his AFO for gait.  Reports increasing parasthesias in feet at night.  It is particularly bad after days when he's on his feet a lot.  Bladder is still "leaky" at times, particularly when he has to bear down to empty his bowels.  It has improved, but he's still not happy with the incontience he has.  He has seen urology in this regard, but wasn't anxious to pursue therpay at the time he saw them. He remains active with his family.  He's actually helping coach baseball currently!   Pain Inventory Average Pain 1 Pain Right Now 0 My pain is aching  In the last 24 hours, has pain interfered with the following? General activity 5 Relation with others 4 Enjoyment of life 9 What TIME of day is your pain at its worst? morning Sleep (in general) Fair  Pain is worse with: walking Pain improves with: N/A Relief from Meds: N/A  Mobility walk without assistance how many minutes can you walk? 30  Function not employed: date last employed 2011 disabled: date disabled 2011  Neuro/Psych bowel control problems numbness  Prior Studies Any changes since last visit?  no  Physicians involved in your care Any changes since last visit?  no      Review of Systems  Constitutional: Negative.   HENT: Negative.   Eyes: Negative.   Respiratory: Negative.   Cardiovascular: Negative.   Gastrointestinal: Negative.   Genitourinary: Negative.   Musculoskeletal: Negative.   Skin: Negative.   Neurological: Positive for numbness.  Hematological: Negative.   Psychiatric/Behavioral: Negative.        Objective:   Physical Exam  Constitutional: He is oriented to person, place, and time. He appears well-developed and well-nourished.  HENT:  Head: Normocephalic.  Eyes: EOM are normal. Pupils are equal,  round, and reactive to light.  Neck: Normal range of motion. Neck supple.  Cardiovascular: Normal rate.   Pulmonary/Chest: Effort normal.  Abdominal: Soft.  Neurological: He is alert and oriented to person, place, and time. He has normal reflexes. A sensory deficit is present. No cranial nerve deficit.       Patient has residual lower extremity sensory loss one out of 2 neither leg left slightly worse than right. He has trace ankle dorsiflexion and plantarflexion to them left side which is an improvement. He does have generalized atrophy of the tibialis anterior and gastrocnemius muscles evident. Knee extension is 3+ to 4/5. Her flexion forward of 5 out of 5 on the left. Right lower extremity grossly 4/5-5 out of 5. Cognitively he is alert and appropriate.          Assessment & Plan:  1. Cauda equina syndrome  -outpt PT to retrial E-stim.  Might do well with an E-stim orthotic to provide a more normal gait pattern.  2. Nerve pain-  -neurontin trial 100mg  q8 hr prn. nighttime only initially. She may only uses when he is more active on a given day.  3.Neurogenic bladder  -pt still with occasional leakage.  Recommended f/u with URO for ?surgical intervention if he's not happy with his current functionality. The likelihood that this should improve naturally on its own at this point is not great.  4. F/u with me in 6 months

## 2011-05-10 ENCOUNTER — Ambulatory Visit: Payer: BC Managed Care – HMO | Attending: Physical Medicine & Rehabilitation | Admitting: Physical Therapy

## 2011-05-10 DIAGNOSIS — R269 Unspecified abnormalities of gait and mobility: Secondary | ICD-10-CM | POA: Insufficient documentation

## 2011-05-10 DIAGNOSIS — IMO0001 Reserved for inherently not codable concepts without codable children: Secondary | ICD-10-CM | POA: Insufficient documentation

## 2011-05-10 DIAGNOSIS — M6281 Muscle weakness (generalized): Secondary | ICD-10-CM | POA: Insufficient documentation

## 2011-05-16 ENCOUNTER — Ambulatory Visit: Payer: BC Managed Care – HMO | Admitting: Physical Therapy

## 2011-05-23 ENCOUNTER — Encounter: Payer: Self-pay | Admitting: Physical Medicine & Rehabilitation

## 2011-05-24 ENCOUNTER — Ambulatory Visit: Payer: BC Managed Care – HMO | Attending: Physical Medicine & Rehabilitation | Admitting: Physical Therapy

## 2011-05-24 DIAGNOSIS — R269 Unspecified abnormalities of gait and mobility: Secondary | ICD-10-CM | POA: Insufficient documentation

## 2011-05-24 DIAGNOSIS — IMO0001 Reserved for inherently not codable concepts without codable children: Secondary | ICD-10-CM | POA: Insufficient documentation

## 2011-05-24 DIAGNOSIS — M6281 Muscle weakness (generalized): Secondary | ICD-10-CM | POA: Insufficient documentation

## 2011-05-27 ENCOUNTER — Telehealth: Payer: Self-pay | Admitting: Physical Medicine & Rehabilitation

## 2011-05-27 NOTE — Telephone Encounter (Signed)
Patient wants an MMES(?), but needs a prescription for it.

## 2011-05-27 NOTE — Telephone Encounter (Signed)
Spoke to pt and I'm still not quite sure what it is that he is needing. I called over to [facility] and spoke to [place] and she said its similar to a TENS unit. Do you know what this is?? Thanks.

## 2011-05-28 NOTE — Telephone Encounter (Signed)
Pt aware rx is ready for him to pick up.

## 2011-05-28 NOTE — Telephone Encounter (Signed)
NMES.  i will hand write a script for it

## 2011-05-31 ENCOUNTER — Ambulatory Visit: Payer: BC Managed Care – HMO | Admitting: Physical Therapy

## 2011-06-07 ENCOUNTER — Ambulatory Visit: Payer: BC Managed Care – HMO | Admitting: Physical Therapy

## 2011-07-04 ENCOUNTER — Ambulatory Visit: Payer: BC Managed Care – HMO | Admitting: Physical Therapy

## 2011-07-08 ENCOUNTER — Other Ambulatory Visit: Payer: Self-pay | Admitting: Urology

## 2011-08-09 ENCOUNTER — Encounter (HOSPITAL_COMMUNITY): Payer: Self-pay | Admitting: Pharmacy Technician

## 2011-08-13 ENCOUNTER — Encounter (HOSPITAL_COMMUNITY): Payer: Self-pay

## 2011-08-13 ENCOUNTER — Encounter (HOSPITAL_COMMUNITY)
Admission: RE | Admit: 2011-08-13 | Discharge: 2011-08-13 | Disposition: A | Discharge status: Home / Self Care | Payer: BC Managed Care – PPO | Source: Ambulatory Visit | Attending: Urology | Admitting: Urology

## 2011-08-13 ENCOUNTER — Ambulatory Visit (HOSPITAL_COMMUNITY)
Admission: RE | Admit: 2011-08-13 | Discharge: 2011-08-13 | Disposition: A | Discharge status: Home / Self Care | Payer: BC Managed Care – PPO | Source: Ambulatory Visit | Attending: Urology | Admitting: Urology

## 2011-08-13 DIAGNOSIS — Z01812 Encounter for preprocedural laboratory examination: Secondary | ICD-10-CM | POA: Insufficient documentation

## 2011-08-13 DIAGNOSIS — N393 Stress incontinence (female) (male): Secondary | ICD-10-CM | POA: Insufficient documentation

## 2011-08-13 DIAGNOSIS — Z01818 Encounter for other preprocedural examination: Secondary | ICD-10-CM | POA: Insufficient documentation

## 2011-08-13 HISTORY — DX: Sleep disorder, unspecified: G47.9

## 2011-08-13 HISTORY — DX: Other abnormalities of gait and mobility: R26.89

## 2011-08-13 HISTORY — DX: Myoneural disorder, unspecified: G70.9

## 2011-08-13 HISTORY — DX: Stress incontinence (female) (male): N39.3

## 2011-08-13 HISTORY — DX: Foot drop, unspecified foot: M21.379

## 2011-08-13 HISTORY — DX: Anesthesia of skin: R20.0

## 2011-08-13 LAB — CBC
HCT: 39.4 % (ref 39.0–52.0)
Hemoglobin: 13.4 g/dL (ref 13.0–17.0)
MCHC: 34 g/dL (ref 30.0–36.0)
MCV: 86.8 fL (ref 78.0–100.0)
RDW: 12.4 % (ref 11.5–15.5)
WBC: 4.6 10*3/uL (ref 4.0–10.5)

## 2011-08-13 LAB — SURGICAL PCR SCREEN
MRSA, PCR: NEGATIVE
Staphylococcus aureus: POSITIVE — AB

## 2011-08-13 LAB — ABO/RH: ABO/RH(D): O POS

## 2011-08-13 LAB — BASIC METABOLIC PANEL
Calcium: 9.6 mg/dL (ref 8.4–10.5)
GFR calc Af Amer: >90 mL/min (ref >90)
GFR calc non Af Amer: >90 mL/min (ref >90)
Glucose, Bld: 97 mg/dL (ref 70–99)
Sodium: 138 mEq/L (ref 135–145)

## 2011-08-13 LAB — PROTIME-INR: INR: 1.07 (ref 0.00–1.49)

## 2011-08-13 NOTE — Patient Instructions (Addendum)
20 Albert Nunez  08/13/2011   Your procedure is scheduled on:  08/20/11 AT 9:30 AM  Report to SHORT STAY DEPT  at 7:00 AM.  Call this number if you have problems the morning of surgery: [phone]   Remember:   Do not eat food or drink liquids AFTER MIDNIGHT    Take these medicines the morning of surgery with A SIP OF WATER: AMLODIPINE / FENFIBRATE   Do not wear jewelry, make-up or nail polish.  Do not wear lotions, powders, or perfumes.   Do not shave legs or underarms 48 hrs. before surgery (men may shave face)  Do not bring valuables to the hospital.  Contacts, dentures or bridgework may not be worn into surgery.  Leave suitcase in the car. After surgery it may be brought to your room.  For patients admitted to the hospital, checkout time is 11:00 AM the day of discharge.   Patients discharged the day of surgery will not be allowed to drive home.    Special Instructions:   Please read over the following fact sheets that you were given: MRSA  Information               SHOWER WITH BETASEPT THE NIGHT BEFORE SURGERY AND THE MORNING OF SURGERY

## 2011-08-19 MED ORDER — GENTAMICIN SULFATE 40 MG/ML IJ SOLN
400.0000 mg | INTRAVENOUS | Status: AC
  Administered 2011-08-20: 400 mg via INTRAVENOUS
  Filled 2011-08-19: qty 10

## 2011-08-19 NOTE — H&P (Signed)
History of Present Illness   Albert Nunez has a stable neurogenic bladder. He no longer leaks at night. He is still soaking 6 or 7 pads a day. He has stress incontinence. He says he does a little bit of pushing and the urine comes out nicely. He empties well.  With some physical therapy his legs are improving but he really has not had much neurologic improvement and he is at month 17 from his spinal stenosis.  Review of systems: No change in bowel or neurologic status.   Urinalysis: I reviewed, negative.   I talked to him about an artificial sphincter.  I drew him a picture and we talked about an artificial sphincter in detail. Pros, cons, general surgical and anesthetic risks, and other options including behavioral therapy, the male sling, and watchful waiting were discussed. He understands that sphincters are successful in 80-90% of cases for stress incontinence (dry in approximately half), 50% for urge incontinence, and that in a small percentage of cases the incontinence can worsen. Surgical risks were described but not limited to the discussion of injury to neighboring structures including the bowel (with possible life-threatening sepsis and colostomy), and bladder, urethra (all resulting in further surgery). The risk of urethral erosion and infection were discussed with sequelae. The risks of malfunction, atrophy, and hernia/skin erosion and management were discussed. Bleeding risks with transfusion rates and risks of perineal numbness and erectile dysfunction were discussed. The risks of urinary retention requiring catheterization and slowing of urinary stream were discussed. We talked about injury to nerves/soft tissue leading to debilitating and intractable pelvic, abdominal, and lower extremity pain syndromes and neuropathies. The usual post-operative course and time of activation was described. The patient understands that he might not reach his treatment goal and that he might be worse  following surgery.  We talked about the rare risk of retention in a neurogenic patient.   Review of systems: No other change in bowel or neurologic status.    Past Medical History Problems  1. History of  Hypercholesterolemia 272.0 2. History of  Hypertension 401.9  Surgical History Problems  1. History of  Back Surgery 2. History of  Leg Repair  Current Meds 1. Aleve TABS; Therapy: (Recorded:19Sep2012) to 2. AmLODIPine Besylate 5 MG Oral Tablet; Therapy: (Recorded:03Aug2012) to 3. Detrol 2 MG Oral Tablet; Therapy: (Recorded:03Aug2012) to 4. Enablex 7.5 MG Oral Tablet Extended Release 24 Hour; TAKE 7.5 MG Daily; Therapy: 26Nov2012  to (Evaluate:21Nov2013); Last Rx:26Nov2012 5. Myrbetriq 25 MG Oral Tablet Extended Release 24 Hour; TAKE 1 TABLET Once; Therapy:  26Nov2012 to (Evaluate:26Dec2012); Last Rx:26Nov2012 6. Toviaz 8 MG Oral Tablet Extended Release 24 Hour; TAKE 1 TABLET DAILY; Therapy: 19Sep2012  to (Evaluate:14Sep2013); Last Rx:19Sep2012 7. VESIcare 5 MG Oral Tablet; take 1 tablet by mouth once daily; Therapy: 19Sep2012 to  (Evaluate:14Sep2013); Last Rx:19Sep2012  Allergies Medication  1. No Known Drug Allergies  Family History Problems  1. Paternal history of  Prostate Cancer V16.42 2. Paternal uncle's history of  Prostate Cancer V16.42  Social History Problems  1. Caffeine Use 2. Marital History - Currently Married 3. Never A Smoker 4. 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 History of  Alcohol Use  Vitals Vital Signs [Data Includes: Last 1 Day]  20May2013 08:53AM  Blood Pressure: 132 / 76 Temperature: 98 F Heart Rate: 52  Results/Data  Urine [Data Includes: Last 1 Day]   20May2013  COLOR YELLOW   APPEARANCE CLEAR   SPECIFIC GRAVITY 1.030   pH 5.5   GLUCOSE  NEG mg/dL  BILIRUBIN NEG   KETONE NEG mg/dL  BLOOD NEG   PROTEIN NEG mg/dL  UROBILINOGEN 0.2 mg/dL  NITRITE NEG   LEUKOCYTE ESTERASE NEG    Assessment Assessed  1. Male Stress  Incontinence 788.32 2. Neurogenic Bladder 596.54  Plan   Discussion/Summary   Albert Nunez is right handed. Postoperative course was discussed. He would like to proceed with an artificial sphincter. He will get a culture 7-10 days prior to surgery. He will take Cipro 3 days prior and 7 days after. Prescription was given.  Hopefully he will reach his treatment goal.  After a thorough review of the management options for the patient's condition the patient  elected to proceed with surgical therapy as noted above. We have discussed the potential benefits and risks of the procedure, side effects of the proposed treatment, the likelihood of the patient achieving the goals of the procedure, and any potential problems that might occur during the procedure or recuperation. Informed consent has been obtained.

## 2011-08-20 ENCOUNTER — Encounter (HOSPITAL_COMMUNITY)
Admission: RE | Disposition: A | Discharge status: Home / Self Care | Payer: Self-pay | Source: Ambulatory Visit | Attending: Urology

## 2011-08-20 ENCOUNTER — Ambulatory Visit (HOSPITAL_COMMUNITY): Payer: BC Managed Care – PPO | Admitting: Anesthesiology

## 2011-08-20 ENCOUNTER — Encounter (HOSPITAL_COMMUNITY): Payer: Self-pay | Admitting: *Deleted

## 2011-08-20 ENCOUNTER — Encounter (HOSPITAL_COMMUNITY): Payer: Self-pay | Admitting: Anesthesiology

## 2011-08-20 ENCOUNTER — Ambulatory Visit (HOSPITAL_COMMUNITY)
Admission: RE | Admit: 2011-08-20 | Discharge: 2011-08-21 | Disposition: A | Discharge status: Home / Self Care | Payer: BC Managed Care – PPO | Source: Ambulatory Visit | Attending: Urology | Admitting: Urology

## 2011-08-20 DIAGNOSIS — I10 Essential (primary) hypertension: Secondary | ICD-10-CM | POA: Insufficient documentation

## 2011-08-20 DIAGNOSIS — E7800 Pure hypercholesterolemia, unspecified: Secondary | ICD-10-CM | POA: Insufficient documentation

## 2011-08-20 DIAGNOSIS — Z01812 Encounter for preprocedural laboratory examination: Secondary | ICD-10-CM | POA: Insufficient documentation

## 2011-08-20 DIAGNOSIS — N393 Stress incontinence (female) (male): Secondary | ICD-10-CM | POA: Insufficient documentation

## 2011-08-20 DIAGNOSIS — Z79899 Other long term (current) drug therapy: Secondary | ICD-10-CM | POA: Insufficient documentation

## 2011-08-20 DIAGNOSIS — N319 Neuromuscular dysfunction of bladder, unspecified: Secondary | ICD-10-CM | POA: Insufficient documentation

## 2011-08-20 HISTORY — PX: URINARY SPHINCTER IMPLANT: SHX2624

## 2011-08-20 LAB — TYPE AND SCREEN: ABO/RH(D): O POS

## 2011-08-20 SURGERY — INSERTION, ARTIFICIAL URINARY SPHINCTER
Anesthesia: General | Site: Urethra | Wound class: Clean

## 2011-08-20 MED ORDER — MORPHINE SULFATE 10 MG/ML IJ SOLN: 2.0000 mg | INTRAMUSCULAR | Status: DC | PRN

## 2011-08-20 MED ORDER — ACETAMINOPHEN 10 MG/ML IV SOLN
INTRAVENOUS | Status: AC
  Filled 2011-08-20: qty 100

## 2011-08-20 MED ORDER — CEFAZOLIN SODIUM 1-5 GM-% IV SOLN
INTRAVENOUS | Status: DC | PRN
  Administered 2011-08-20: 2 g via INTRAVENOUS

## 2011-08-20 MED ORDER — GLYCOPYRROLATE 0.2 MG/ML IJ SOLN
INTRAMUSCULAR | Status: DC | PRN
  Administered 2011-08-20: .8 mg via INTRAVENOUS
  Administered 2011-08-20 (×2): 0.2 mg via INTRAVENOUS

## 2011-08-20 MED ORDER — NEOSTIGMINE METHYLSULFATE 1 MG/ML IJ SOLN
INTRAMUSCULAR | Status: DC | PRN
  Administered 2011-08-20: 4 mg via INTRAVENOUS

## 2011-08-20 MED ORDER — MIDAZOLAM HCL 5 MG/5ML IJ SOLN
INTRAMUSCULAR | Status: DC | PRN
  Administered 2011-08-20: 2 mg via INTRAVENOUS

## 2011-08-20 MED ORDER — FENTANYL CITRATE 0.05 MG/ML IJ SOLN: 25.0000 ug | INTRAMUSCULAR | Status: DC | PRN

## 2011-08-20 MED ORDER — PROPOFOL 10 MG/ML IV BOLUS
INTRAVENOUS | Status: DC | PRN
  Administered 2011-08-20: 180 mg via INTRAVENOUS

## 2011-08-20 MED ORDER — HYDROCODONE-ACETAMINOPHEN 5-325 MG PO TABS: 1.0000 | ORAL_TABLET | ORAL | Status: DC | PRN

## 2011-08-20 MED ORDER — LIDOCAINE HCL (CARDIAC) 20 MG/ML IV SOLN
INTRAVENOUS | Status: DC | PRN
  Administered 2011-08-20: 50 mg via INTRAVENOUS

## 2011-08-20 MED ORDER — FENOFIBRATE 160 MG PO TABS
160.0000 mg | ORAL_TABLET | Freq: Every day | ORAL | Status: DC
  Administered 2011-08-21: 160 mg via ORAL
  Filled 2011-08-20: qty 1

## 2011-08-20 MED ORDER — AMLODIPINE BESYLATE 5 MG PO TABS
5.0000 mg | ORAL_TABLET | Freq: Every day | ORAL | Status: DC
  Administered 2011-08-21: 5 mg via ORAL
  Filled 2011-08-20: qty 1

## 2011-08-20 MED ORDER — EPHEDRINE SULFATE 50 MG/ML IJ SOLN
INTRAMUSCULAR | Status: DC | PRN
  Administered 2011-08-20: 5 mg via INTRAVENOUS
  Administered 2011-08-20: 10 mg via INTRAVENOUS

## 2011-08-20 MED ORDER — LACTATED RINGERS IV SOLN
INTRAVENOUS | Status: DC
  Administered 2011-08-20: 1000 mL via INTRAVENOUS
  Administered 2011-08-20: 12:00:00 via INTRAVENOUS

## 2011-08-20 MED ORDER — HYDROCODONE-ACETAMINOPHEN 5-325 MG PO TABS: 1.0000 | ORAL_TABLET | Freq: Four times a day (QID) | ORAL | Status: AC | PRN

## 2011-08-20 MED ORDER — CEFAZOLIN SODIUM-DEXTROSE 2-3 GM-% IV SOLR
INTRAVENOUS | Status: AC
  Filled 2011-08-20: qty 50

## 2011-08-20 MED ORDER — PROMETHAZINE HCL 25 MG/ML IJ SOLN: 6.2500 mg | INTRAMUSCULAR | Status: DC | PRN

## 2011-08-20 MED ORDER — FENTANYL CITRATE 0.05 MG/ML IJ SOLN
INTRAMUSCULAR | Status: DC | PRN
  Administered 2011-08-20: 50 ug via INTRAVENOUS
  Administered 2011-08-20: 100 ug via INTRAVENOUS
  Administered 2011-08-20 (×2): 50 ug via INTRAVENOUS

## 2011-08-20 MED ORDER — SODIUM CHLORIDE 0.9 % IR SOLN
Status: DC | PRN
  Administered 2011-08-20: 10:00:00

## 2011-08-20 MED ORDER — DEXTROSE-NACL 5-0.45 % IV SOLN
INTRAVENOUS | Status: DC
  Administered 2011-08-20: 125 mL/h via INTRAVENOUS
  Administered 2011-08-21: 03:00:00 via INTRAVENOUS

## 2011-08-20 MED ORDER — STERILE WATER FOR IRRIGATION IR SOLN
Status: DC | PRN
  Administered 2011-08-20: 1000 mL

## 2011-08-20 MED ORDER — ONDANSETRON HCL 4 MG/2ML IJ SOLN
INTRAMUSCULAR | Status: DC | PRN
  Administered 2011-08-20: 4 mg via INTRAVENOUS

## 2011-08-20 MED ORDER — ACETAMINOPHEN 10 MG/ML IV SOLN
1000.0000 mg | Freq: Four times a day (QID) | INTRAVENOUS | Status: DC
  Administered 2011-08-20 – 2011-08-21 (×3): 1000 mg via INTRAVENOUS
  Filled 2011-08-20 (×4): qty 100

## 2011-08-20 MED ORDER — CEFAZOLIN SODIUM 1-5 GM-% IV SOLN
INTRAVENOUS | Status: AC
  Filled 2011-08-20: qty 50

## 2011-08-20 MED ORDER — ROCURONIUM BROMIDE 100 MG/10ML IV SOLN
INTRAVENOUS | Status: DC | PRN
  Administered 2011-08-20: 50 mg via INTRAVENOUS
  Administered 2011-08-20: 5 mg via INTRAVENOUS
  Administered 2011-08-20: 10 mg via INTRAVENOUS

## 2011-08-20 MED ORDER — ACETAMINOPHEN 10 MG/ML IV SOLN
INTRAVENOUS | Status: DC | PRN
  Administered 2011-08-20: 2 mg via INTRAVENOUS
  Administered 2011-08-20: 1000 mg via INTRAVENOUS

## 2011-08-20 SURGICAL SUPPLY — 57 items
ADH SKN CLS APL DERMABOND .7 (GAUZE/BANDAGES/DRESSINGS) ×2
BAG URINE DRAINAGE (UROLOGICAL SUPPLIES) ×3 IMPLANT
BAG URO CATCHER STRL LF (DRAPE) ×3 IMPLANT
BALLOON PRESSURE REGUL 61 70CM (Miscellaneous) ×1 IMPLANT
BANDAGE GAUZE ELAST BULKY 4 IN (GAUZE/BANDAGES/DRESSINGS) ×3 IMPLANT
BLADE SURG 15 STRL LF DISP TIS (BLADE) ×4 IMPLANT
BLADE SURG 15 STRL SS (BLADE) ×6
BRIEF STRETCH FOR OB PAD LRG (UNDERPADS AND DIAPERS) ×3 IMPLANT
CANISTER SUCTION 2500CC (MISCELLANEOUS) ×3 IMPLANT
CATH FOLEY 2WAY SLVR  5CC 14FR (CATHETERS) ×1
CATH FOLEY 2WAY SLVR 5CC 14FR (CATHETERS) ×2 IMPLANT
CLOTH BEACON ORANGE TIMEOUT ST (SAFETY) ×3 IMPLANT
CONTROL PUMP (Urological Implant) ×2 IMPLANT
COVER MAYO STAND STRL (DRAPES) ×6 IMPLANT
CUFF URINARY OCCL 4. IZ (Miscellaneous) ×2 IMPLANT
DERMABOND ADVANCED (GAUZE/BANDAGES/DRESSINGS) ×1
DERMABOND ADVANCED .7 DNX12 (GAUZE/BANDAGES/DRESSINGS) ×3 IMPLANT
DISSECTOR ROUND CHERRY 3/8 STR (MISCELLANEOUS) ×3 IMPLANT
DRAPE CAMERA CLOSED 9X96 (DRAPES) ×3 IMPLANT
DRAPE LG THREE QUARTER DISP (DRAPES) ×3 IMPLANT
DRESSING TELFA 8X3 (GAUZE/BANDAGES/DRESSINGS) ×3 IMPLANT
ELECT REM PT RETURN 9FT ADLT (ELECTROSURGICAL) ×3
ELECTRODE REM PT RTRN 9FT ADLT (ELECTROSURGICAL) ×2 IMPLANT
GAUZE SPONGE 4X4 16PLY XRAY LF (GAUZE/BANDAGES/DRESSINGS) ×6 IMPLANT
GLOVE BIOGEL M STRL SZ7.5 (GLOVE) ×9 IMPLANT
GOWN PREVENTION PLUS XLARGE (GOWN DISPOSABLE) ×3 IMPLANT
GOWN STRL REIN XL XLG (GOWN DISPOSABLE) ×3 IMPLANT
HEMOSTAT SURGICEL 4X8 (HEMOSTASIS) ×2 IMPLANT
KIT ACCESSORY AMS 800 ×2 IMPLANT
KIT BASIN OR (CUSTOM PROCEDURE TRAY) ×3 IMPLANT
LOOP VESSEL MAXI BLUE (MISCELLANEOUS) ×3 IMPLANT
LUBRICANT JELLY K Y 4OZ (MISCELLANEOUS) ×3 IMPLANT
NEEDLE INJECT RIGID (NEEDLE) IMPLANT
NEEDLE INJECT RIGID 21GA 14.6 (NEEDLE) IMPLANT
NS IRRIG 1000ML POUR BTL (IV SOLUTION) ×3 IMPLANT
PACK BASIC VI WITH GOWN DISP (CUSTOM PROCEDURE TRAY) ×3 IMPLANT
PACK CYSTO (CUSTOM PROCEDURE TRAY) ×3 IMPLANT
PENCIL BUTTON HOLSTER BLD 10FT (ELECTRODE) ×3 IMPLANT
PLUG CATH AND CAP STER (CATHETERS) ×3 IMPLANT
PRESS REG BALL 61 70CM (Miscellaneous) ×3 IMPLANT
SET CYSTO W/LG BORE CLAMP LF (SET/KITS/TRAYS/PACK) ×3 IMPLANT
SHEET LAVH (DRAPES) ×3 IMPLANT
SLEEVE STOCKINETTE LIMB 4X8 (MISCELLANEOUS) ×2 IMPLANT
SUT CHROMIC 3 0 SH 27 (SUTURE) IMPLANT
SUT MNCRL AB 4-0 PS2 18 (SUTURE) ×6 IMPLANT
SUT SILK 0 FSL (SUTURE) ×3 IMPLANT
SUT VIC AB 0 CT1 27 (SUTURE) ×3
SUT VIC AB 0 CT1 27XBRD ANTBC (SUTURE) ×2 IMPLANT
SUT VIC AB 3-0 SH 27 (SUTURE) ×12
SUT VIC AB 3-0 SH 27X BRD (SUTURE) ×8 IMPLANT
SYR 30ML LL (SYRINGE) ×3 IMPLANT
SYR BULB IRRIGATION 50ML (SYRINGE) ×3 IMPLANT
SYRINGE 10CC LL (SYRINGE) ×6 IMPLANT
TOWEL OR 17X26 10 PK STRL BLUE (TOWEL DISPOSABLE) ×6 IMPLANT
TUBING CONNECTING 10 (TUBING) ×3 IMPLANT
WATER STERILE IRR 1500ML POUR (IV SOLUTION) ×3 IMPLANT
YANKAUER SUCT BULB TIP 10FT TU (MISCELLANEOUS) ×3 IMPLANT

## 2011-08-20 NOTE — Anesthesia Preprocedure Evaluation (Signed)
Anesthesia Evaluation  Patient identified by MRN, date of birth, ID band Patient awake    Reviewed: Allergy & Precautions, H&P , NPO status , Patient's Chart, lab work & pertinent test results  Airway Mallampati: II TM Distance: >3 FB Neck ROM: Full    Dental No notable dental hx.    Pulmonary neg pulmonary ROS,  breath sounds clear to auscultation  Pulmonary exam normal       Cardiovascular hypertension, Pt. on medications Rhythm:Regular Rate:Normal     Neuro/Psych Cauda equina syndrome. Chronic pain.  Neuromuscular disease negative psych ROS   GI/Hepatic negative GI ROS, Neg liver ROS,   Endo/Other  negative endocrine ROS  Renal/GU negative Renal ROS  negative genitourinary   Musculoskeletal negative musculoskeletal ROS (+)   Abdominal   Peds negative pediatric ROS (+)  Hematology negative hematology ROS (+)   Anesthesia Other Findings   Reproductive/Obstetrics negative OB ROS                           Anesthesia Physical Anesthesia Plan  ASA: II  Anesthesia Plan: General   Post-op Pain Management:    Induction: Intravenous  Airway Management Planned: Oral ETT  Additional Equipment:   Intra-op Plan:   Post-operative Plan: Extubation in OR  Informed Consent: I have reviewed the patients History and Physical, chart, labs and discussed the procedure including the risks, benefits and alternatives for the proposed anesthesia with the patient or authorized representative who has indicated his/her understanding and acceptance.   Dental advisory given  Plan Discussed with: CRNA  Anesthesia Plan Comments:         Anesthesia Quick Evaluation

## 2011-08-20 NOTE — Op Note (Signed)
Preoperative diagnosis: Stress urinary incontinence Postoperative diagnosis: Stress urinary incontinence Surgery: Implantation of artificial urinary sphincter Surgeon: Dr. Lorin Picket Quentin Strebel Asst.: Pecola Leisure  The patient consented to the above procedure for the above diagnoses. Extra care was taken with leg positioning to minimize the risk of neuropathy deep vein thrombosis and compartment syndrome. Preoperative antibiotics were given.  Sterile 10 minute scrub was utilized followed by my usual drape.  I made approximately 5-6 cm perineal incision is dissected to the subcutaneous tissue mobilizing the thin bulbospongiosus muscle. He a lot of atrophy of the muscle. I sharply mobilized it circumferentially at the level of the bulbar urethra at the bifurcation of the corporal bodies. Using my usual technique with a small right angle I made a small opening is in the sail of tissue next to the corporal bodies after my box technique allowing me to elevate the bulbar urethra off the corporal bodies. I enlarged the hole staying away from the urethra. A little bit of Bovie was used dorsally on the corporal bodies. Eventually I placed a small piece of Surgicel for minimal bleeding.  And measured a 4 cm cuff. I then directed my dissection to the right lower quadrant marking all landmarks  I made a 5-6 cm right lower quadrant incision in a skin fold in the right lower quadrant. I dissected down to expose the aponeurosis of the external oblique. I didn't open the fascia and the size of along its fibers. Staying lateral and a large Kelly I separated the muscle down to the level of the transversalis fascia breaking through to the preperitoneal space. A prepared reservoir that was empty was applied then instilling 25 cc of saline. I then closed the rectus fascia with running 0 Vicryl and CT1 needle. Irrigation was utilized.  I finger dissected to the high right hemiscrotum with my usual technique. I delivered  the right hemiscrotum with a peanut in my right hand and Richardson retractor. I mobilized a nice pocket. I delivered a prepared pump to the right hemiscrotum laid in nice position utilizing a ring forcep. Stabilized with my usual technique  Utilizing quick connection connect all tubing is appropriately. I then cycled the device 3 times and locked it in place with a moderate dimple  After irrigating all incisions the perineal incision was closed with running 3-0 Vicryl on an SH needle. There is very little muscle closed. 4-0 subcuticular was used for the skin. The abdominal incision was closed with 3-0 Vicryl staying away from the tubing and 4-0 subcuticular. Sterile dressing was applied. Blood loss was less than 30 mL. Leg position was good

## 2011-08-20 NOTE — Transfer of Care (Signed)
Immediate Anesthesia Transfer of Care Note  Patient: Albert Nunez  Procedure(s) Performed: Procedure(s) (LRB): ARTIFICIAL URINARY SPHINCTER (N/A)  Patient Location: PACU  Anesthesia Type: General  Level of Consciousness: sedated, patient cooperative and responds to stimulaton  Airway & Oxygen Therapy: Patient Spontanous Breathing and Patient connected to face mask oxgen  Post-op Assessment: Report given to PACU RN and Post -op Vital signs reviewed and stable  Post vital signs: Reviewed and stable  Complications: No apparent anesthesia complications

## 2011-08-20 NOTE — Anesthesia Postprocedure Evaluation (Signed)
  Anesthesia Post-op Note  Patient: Albert Nunez  Procedure(s) Performed: Procedure(s) (LRB): ARTIFICIAL URINARY SPHINCTER (N/A)  Patient Location: PACU  Anesthesia Type: General  Level of Consciousness: awake and alert   Airway and Oxygen Therapy: Patient Spontanous Breathing  Post-op Pain: mild  Post-op Assessment: Post-op Vital signs reviewed, Patient's Cardiovascular Status Stable, Respiratory Function Stable, Patent Airway and No signs of Nausea or vomiting  Post-op Vital Signs: stable  Complications: No apparent anesthesia complications

## 2011-08-20 NOTE — Interval H&P Note (Signed)
History and Physical Interval Note:  08/20/2011 9:54 AM  Albert Nunez  has presented today for surgery, with the diagnosis of Stress Incontinence  The various methods of treatment have been discussed with the patient and family. After consideration of risks, benefits and other options for treatment, the patient has consented to  Procedure(s) (LRB): ARTIFICIAL URINARY SPHINCTER (N/A) CYSTOSCOPY (N/A) as a surgical intervention .  The patient's history has been reviewed, patient examined, no change in status, stable for surgery.  I have reviewed the patients' chart and labs.  Questions were answered to the patient's satisfaction.     Khadijatou Borak A

## 2011-08-21 ENCOUNTER — Encounter (HOSPITAL_COMMUNITY): Payer: Self-pay | Admitting: Urology

## 2011-08-21 LAB — BASIC METABOLIC PANEL WITH GFR
BUN: 11 mg/dL (ref 6–23)
CO2: 25 meq/L (ref 19–32)
Calcium: 8.7 mg/dL (ref 8.4–10.5)
Chloride: 104 meq/L (ref 96–112)
Creatinine, Ser: 0.84 mg/dL (ref 0.50–1.35)
GFR calc Af Amer: >90 mL/min
GFR calc non Af Amer: >90 mL/min
Glucose, Bld: 113 mg/dL — ABNORMAL HIGH (ref 70–99)
Potassium: 3.6 meq/L (ref 3.5–5.1)
Sodium: 135 meq/L (ref 135–145)

## 2011-08-21 MED ORDER — SULFAMETHOXAZOLE-TRIMETHOPRIM 800-160 MG PO TABS: 1.0000 | ORAL_TABLET | Freq: Two times a day (BID) | ORAL | Status: AC

## 2011-08-21 NOTE — Progress Notes (Signed)
Vitals normal Laboratory tests normal Patient alert and stable Pain minimal and well-controlled Post treatment course discussed in detail Followup discussed in detail Cuff deactivated See orders

## 2011-10-30 ENCOUNTER — Encounter
Payer: BC Managed Care – PPO | Attending: Physical Medicine & Rehabilitation | Admitting: Physical Medicine & Rehabilitation

## 2011-10-30 ENCOUNTER — Encounter: Payer: Self-pay | Admitting: Physical Medicine & Rehabilitation

## 2011-10-30 VITALS — BP 134/75 | HR 92 | Resp 14 | Ht 71.0 in | Wt 212.0 lb

## 2011-10-30 DIAGNOSIS — IMO0002 Reserved for concepts with insufficient information to code with codable children: Secondary | ICD-10-CM

## 2011-10-30 DIAGNOSIS — G834 Cauda equina syndrome: Secondary | ICD-10-CM | POA: Insufficient documentation

## 2011-10-30 DIAGNOSIS — N319 Neuromuscular dysfunction of bladder, unspecified: Secondary | ICD-10-CM

## 2011-10-30 DIAGNOSIS — M792 Neuralgia and neuritis, unspecified: Secondary | ICD-10-CM

## 2011-10-30 DIAGNOSIS — G5790 Unspecified mononeuropathy of unspecified lower limb: Secondary | ICD-10-CM | POA: Insufficient documentation

## 2011-10-30 DIAGNOSIS — M79609 Pain in unspecified limb: Secondary | ICD-10-CM | POA: Insufficient documentation

## 2011-10-30 DIAGNOSIS — R32 Unspecified urinary incontinence: Secondary | ICD-10-CM | POA: Insufficient documentation

## 2011-10-30 DIAGNOSIS — R269 Unspecified abnormalities of gait and mobility: Secondary | ICD-10-CM | POA: Insufficient documentation

## 2011-10-30 NOTE — Patient Instructions (Signed)
Try CAPSAICIN CREAM for your foot pain. You may experience increased pain in your feet the first week you try it   You may also try ibuprofen or naproxen.

## 2011-10-30 NOTE — Progress Notes (Signed)
Subjective:    Patient ID: Albert Nunez, male    DOB: Mar 10, 1960, 51 y.o.   MRN: [id number]  HPI Albert Nunez is back regarding his cauda equina syndrome. HE had his artificial urinary sphincter done in early July and has good results thus far. It hasn't been turned on yet, but his incontinence has been better.  He is complaining of increasd pain in either foot, which he describes as tingling and burning. It tends to be worse the more hes up on them. He also is complaingn of increased left hip pain.  Pain Inventory Average Pain 0 Pain Right Now 0 My pain is intermittent  In the last 24 hours, has pain interfered with the following? General activity 0 Relation with others 0 Enjoyment of life 0 What TIME of day is your pain at its worst? night Sleep (in general) Fair  Pain is worse with: walking Pain improves with: rest Relief from Meds: 0  Mobility use a cane ability to climb steps?  yes do you drive?  yes Do you have any goals in this area?  yes  Function not employed: date last employed 01/2010 disabled: date disabled 01/2010  Neuro/Psych bladder control problems numbness tingling  Prior Studies Any changes since last visit?  no  Physicians involved in your care Any changes since last visit?  no   Family History  Problem Relation Age of Onset  . Cancer Mother   . Hypertension Mother   . Heart disease Father   . Cancer Father   . Hypertension Father    History   Social History  . Marital Status: Married    Spouse Name: N/A    Number of Children: N/A  . Years of Education: N/A   Social History Main Topics  . Smoking status: Never Smoker   . Smokeless tobacco: None  . Alcohol Use: No  . Drug Use: No  . Sexually Active: None   Other Topics Concern  . None   Social History Narrative  . None   Past Surgical History  Procedure Date  . Spine surgery   . Tibia fracture surgery   . Vena cava filter placement   . Urinary sphincter implant 08/20/2011   Procedure: ARTIFICIAL URINARY SPHINCTER;  Surgeon: Martina Sinner, MD;  Location: [facility];  Service: Urology;  Laterality: N/A;   Past Medical History  Diagnosis Date  . Hypertension   . Hyperlipidemia   . 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 Neuromuscular disorder     NERVE DAMAGE /  S2 FRACTURE  . Numbness in feet   . Stress incontinence   . Difficulty sleeping   . Balance problems    BP 134/75  Pulse 92  Resp 14  Ht 5\' 11"  (1.803 m)  Wt 212 lb (96.163 kg)  BMI 29.57 kg/m2  SpO2 100%     Review of Systems  Musculoskeletal: Positive for gait problem.  Neurological: Positive for numbness.  All other systems reviewed and are negative.       Objective:   Physical Exam GENERAL: The patient is pleasant and alert. He had diminished  sensation over the left leg particularly over the plantar aspect of the  foot. He has some general pressure sense over the dorsum of the foot.  He has perhaps trace ankle dorsiflexion and absent plantar flexion.  Knee extension is 4/5, flexion is a bit less perhaps 4-/5. Hip flexion  is 4/5. Hip extension is 4/5.  Hip abduction is 4+/5. Hip abduction is  4/5. Had a walk for me today. He tends to have some issues still with  position sense on the left foot while standing and stance phase. He drops the left hip significantly with stance and the knee tends to hyperflex as well.   ASSESSMENT:  1. 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  2. History of neurogenic bladder with incontinence 3. Neuropathic foot pain   PLAN:  1. Continue with strength training. I advised Albert Nunez to work on his technique as he has learned Publishing copy, compensatory habits. I think he would benefit from using his cane more often. 2. Discussed the use of capsaicin cream for his neuropathic pain. He may also try over the counter ibuprofen or naproxen. If he has further issues i instructed him to call the office and we can start neurontin. At this  point he wants to stay conservative with management. 3. He'll see me back PRN. All questions were encouraged and answered.

## 2012-02-01 IMAGING — CR DG PORTABLE PELVIS
1 series · 1 of 1 positions shown · non-contrast
Comparison: None.

CLINICAL DATA: Low back pain status post fall from 15 feet.

PORTABLE PELVIS

[view not recorded]
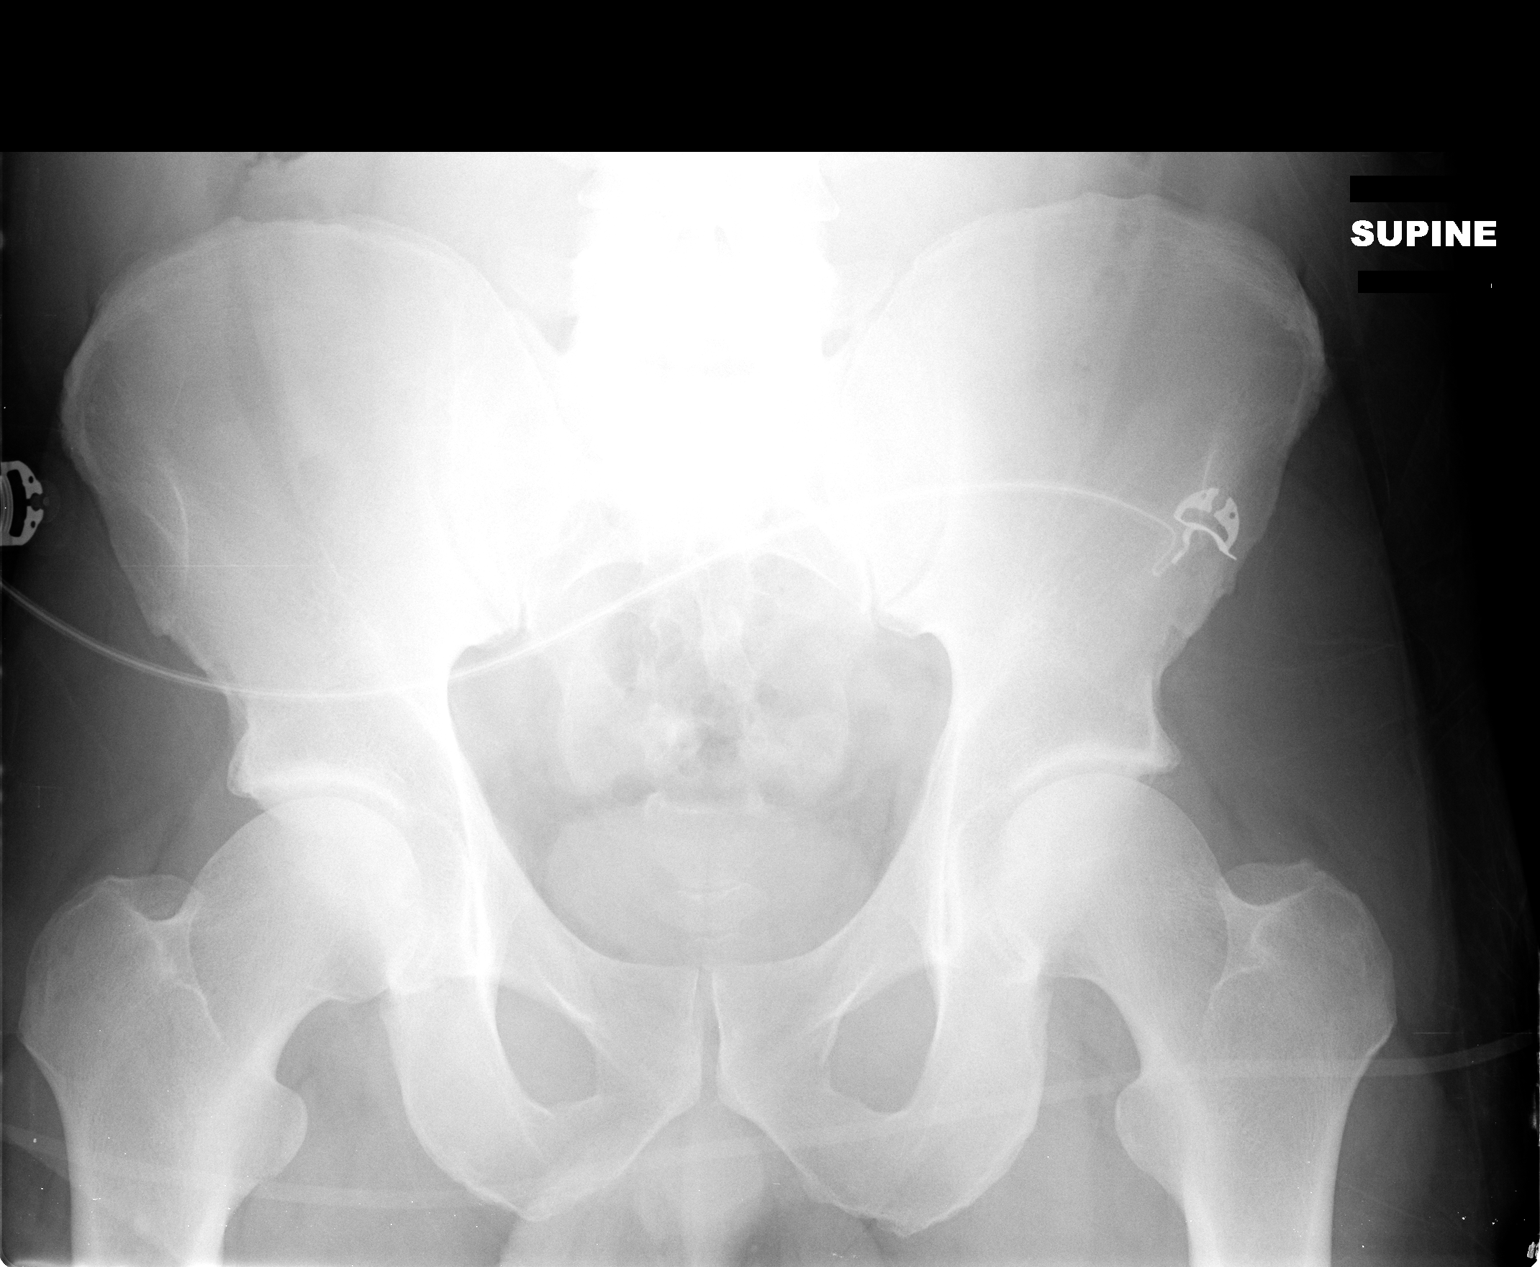

[1 of 1 positions shown; findings below may reference images not displayed]

FINDINGS: 7811 hours. The mineralization and alignment are normal.
There is no evidence of acute fracture or dislocation.  The
sacroiliac joints appear intact.  No soft tissue abnormalities are
identified.
IMPRESSION: No acute osseous findings.

## 2012-02-03 IMAGING — CR DG CHEST 1V PORT
1 series · 1 of 1 positions shown · non-contrast
Comparison: 02/02/2010

CLINICAL DATA: Fall, lumbar fracture.

PORTABLE CHEST - 1 VIEW

[view not recorded]
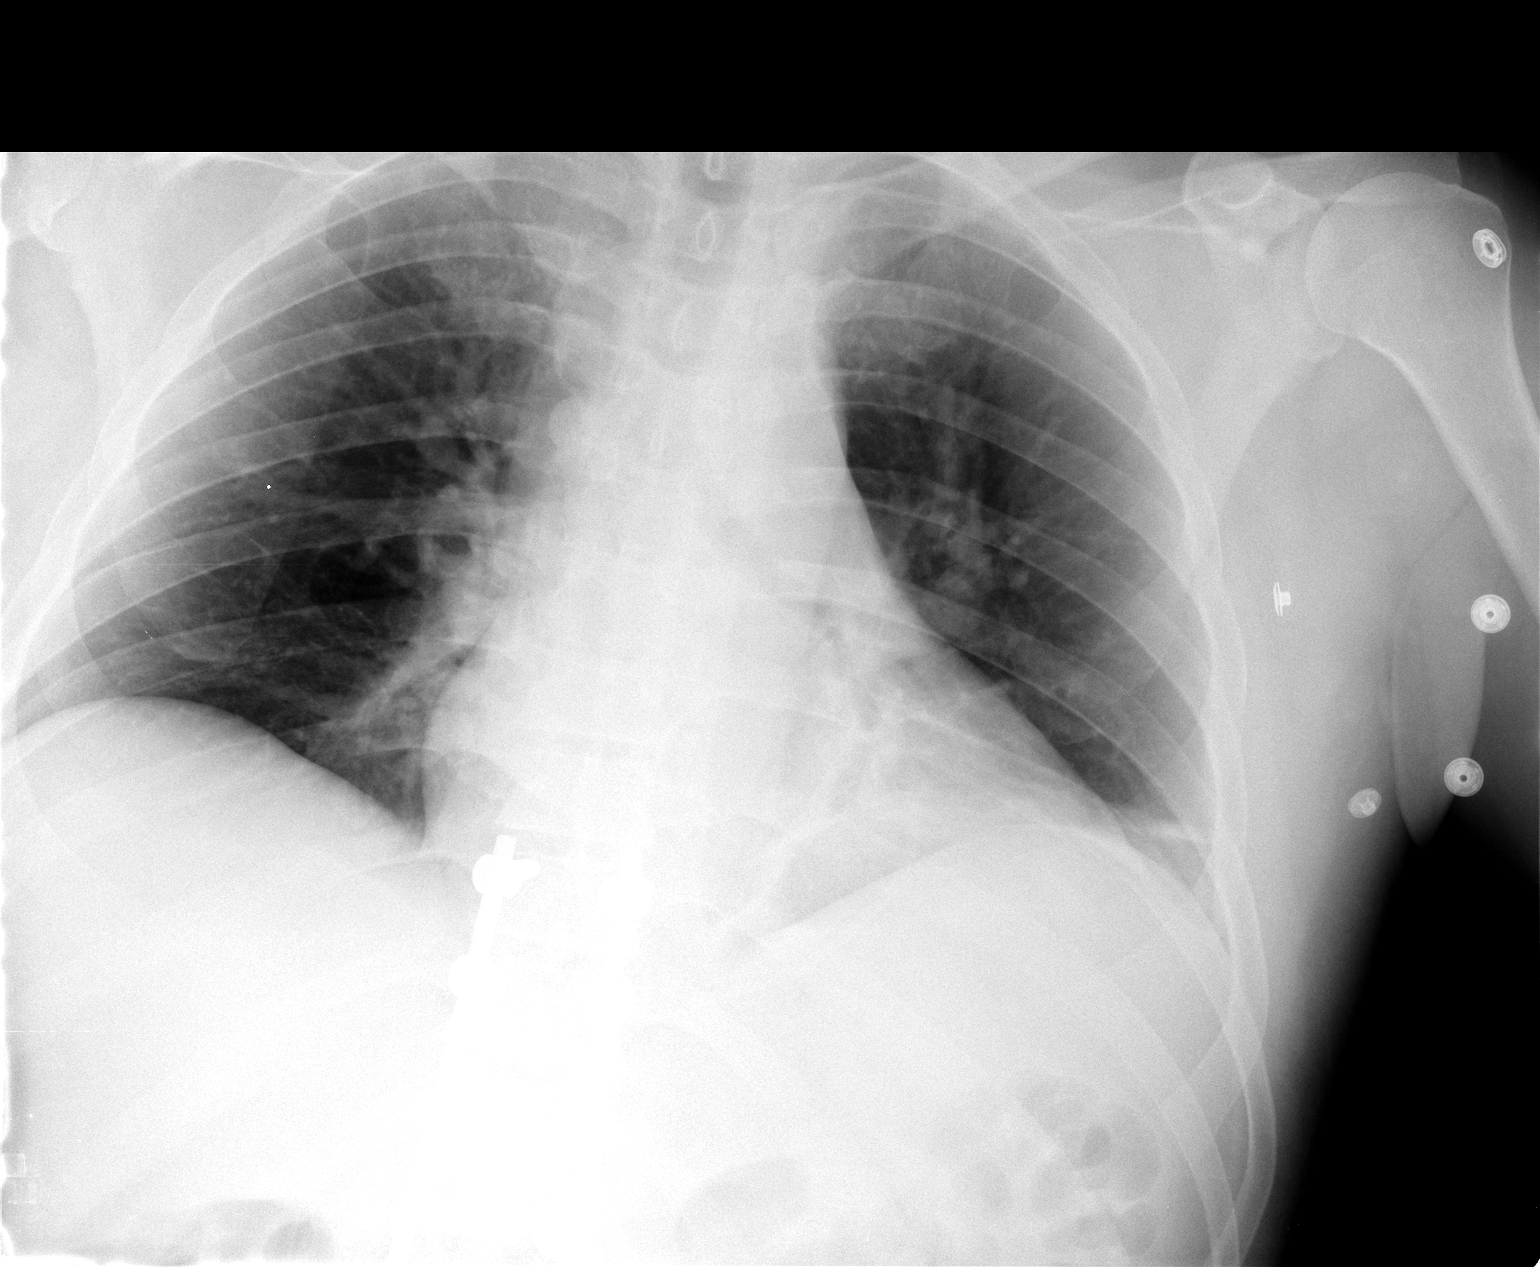

[1 of 1 positions shown; findings below may reference images not displayed]

FINDINGS: There are low lung volumes.  Left base atelectasis
present.  Heart is borderline in size.  No change since prior
study.  Posterior spinal hardware noted.
IMPRESSION: Left base atelectasis.  Borderline heart size.  No change.

## 2012-02-04 IMAGING — CR DG CERVICAL SPINE 2 OR 3 VIEWS
2 series · 2 of 2 positions shown · non-contrast
Comparison: Cervical spine CT dated 02/01/2010

CLINICAL DATA: Fall from ladder with lumbar burst fracture
requiring surgical stabilization.

CERVICAL SPINE - 2-3 VIEW

[w c-spine lat (1 of 2)]
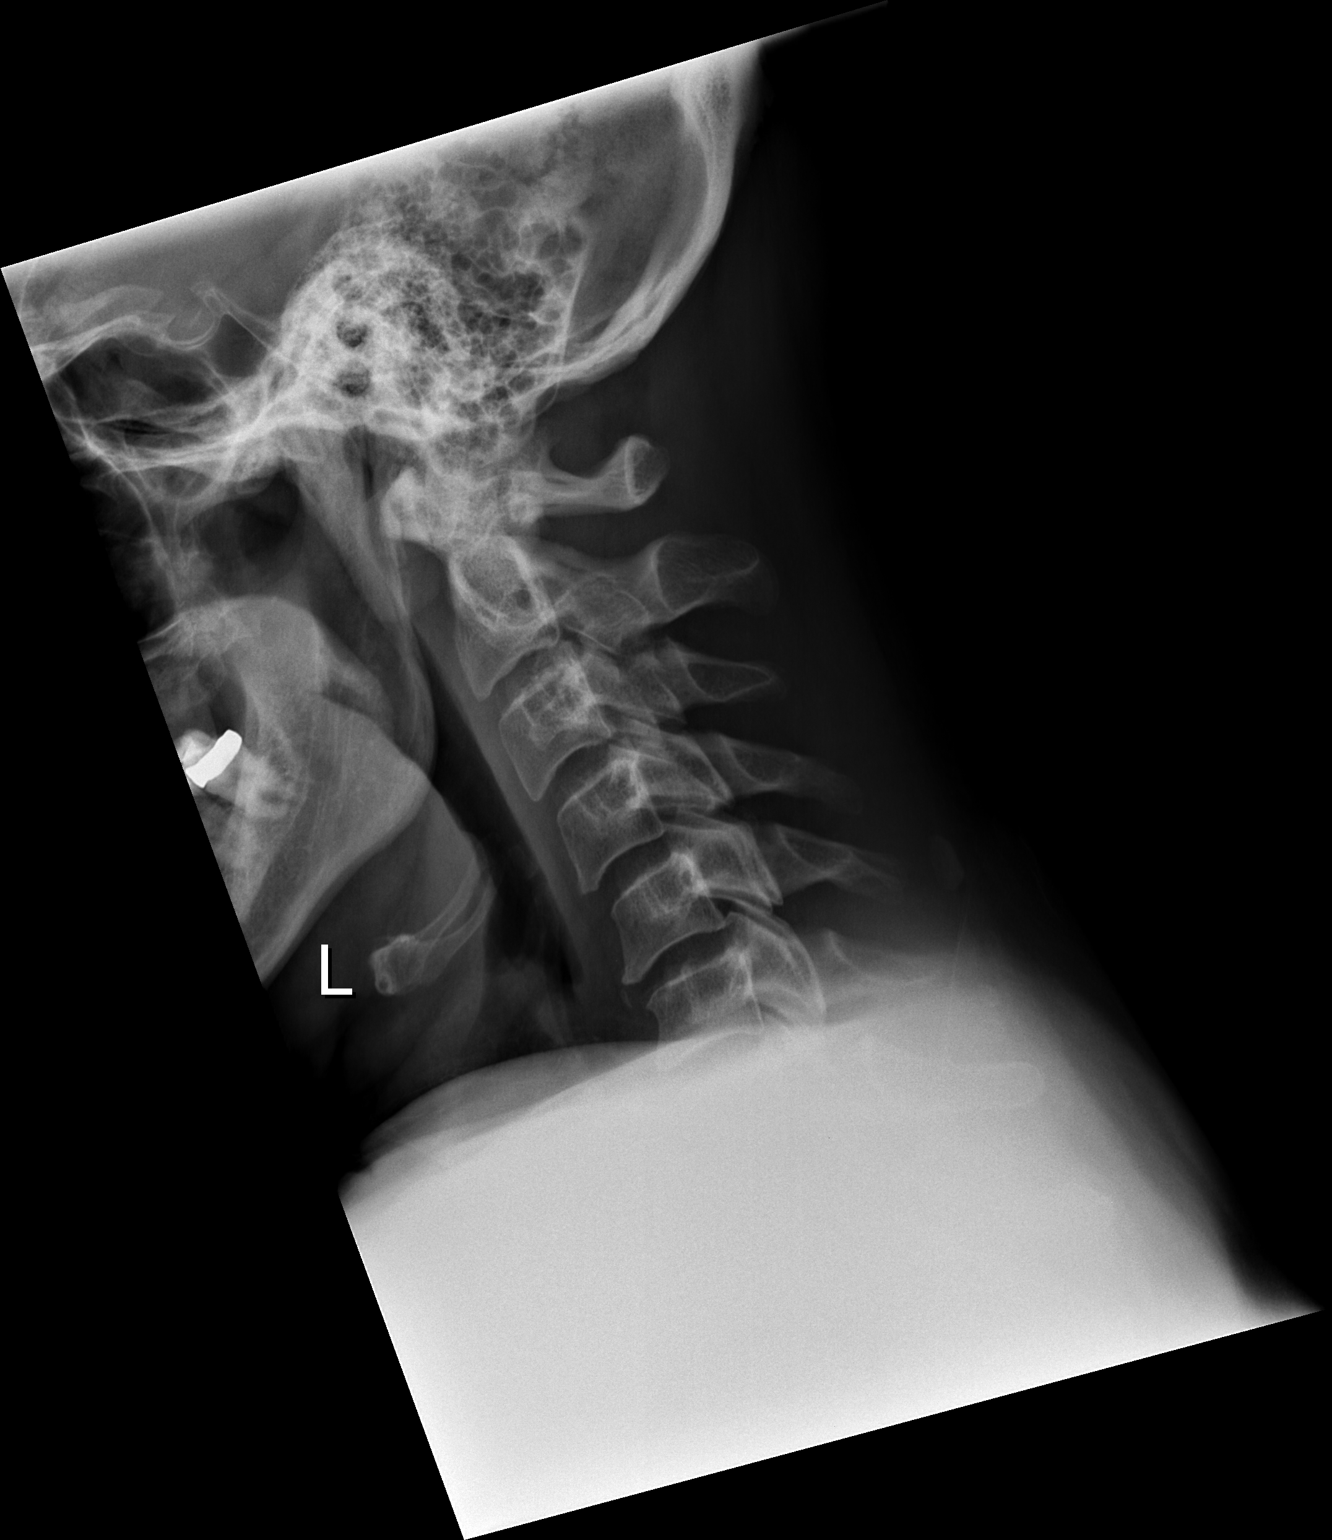

[w c-spine lat (2 of 2)]
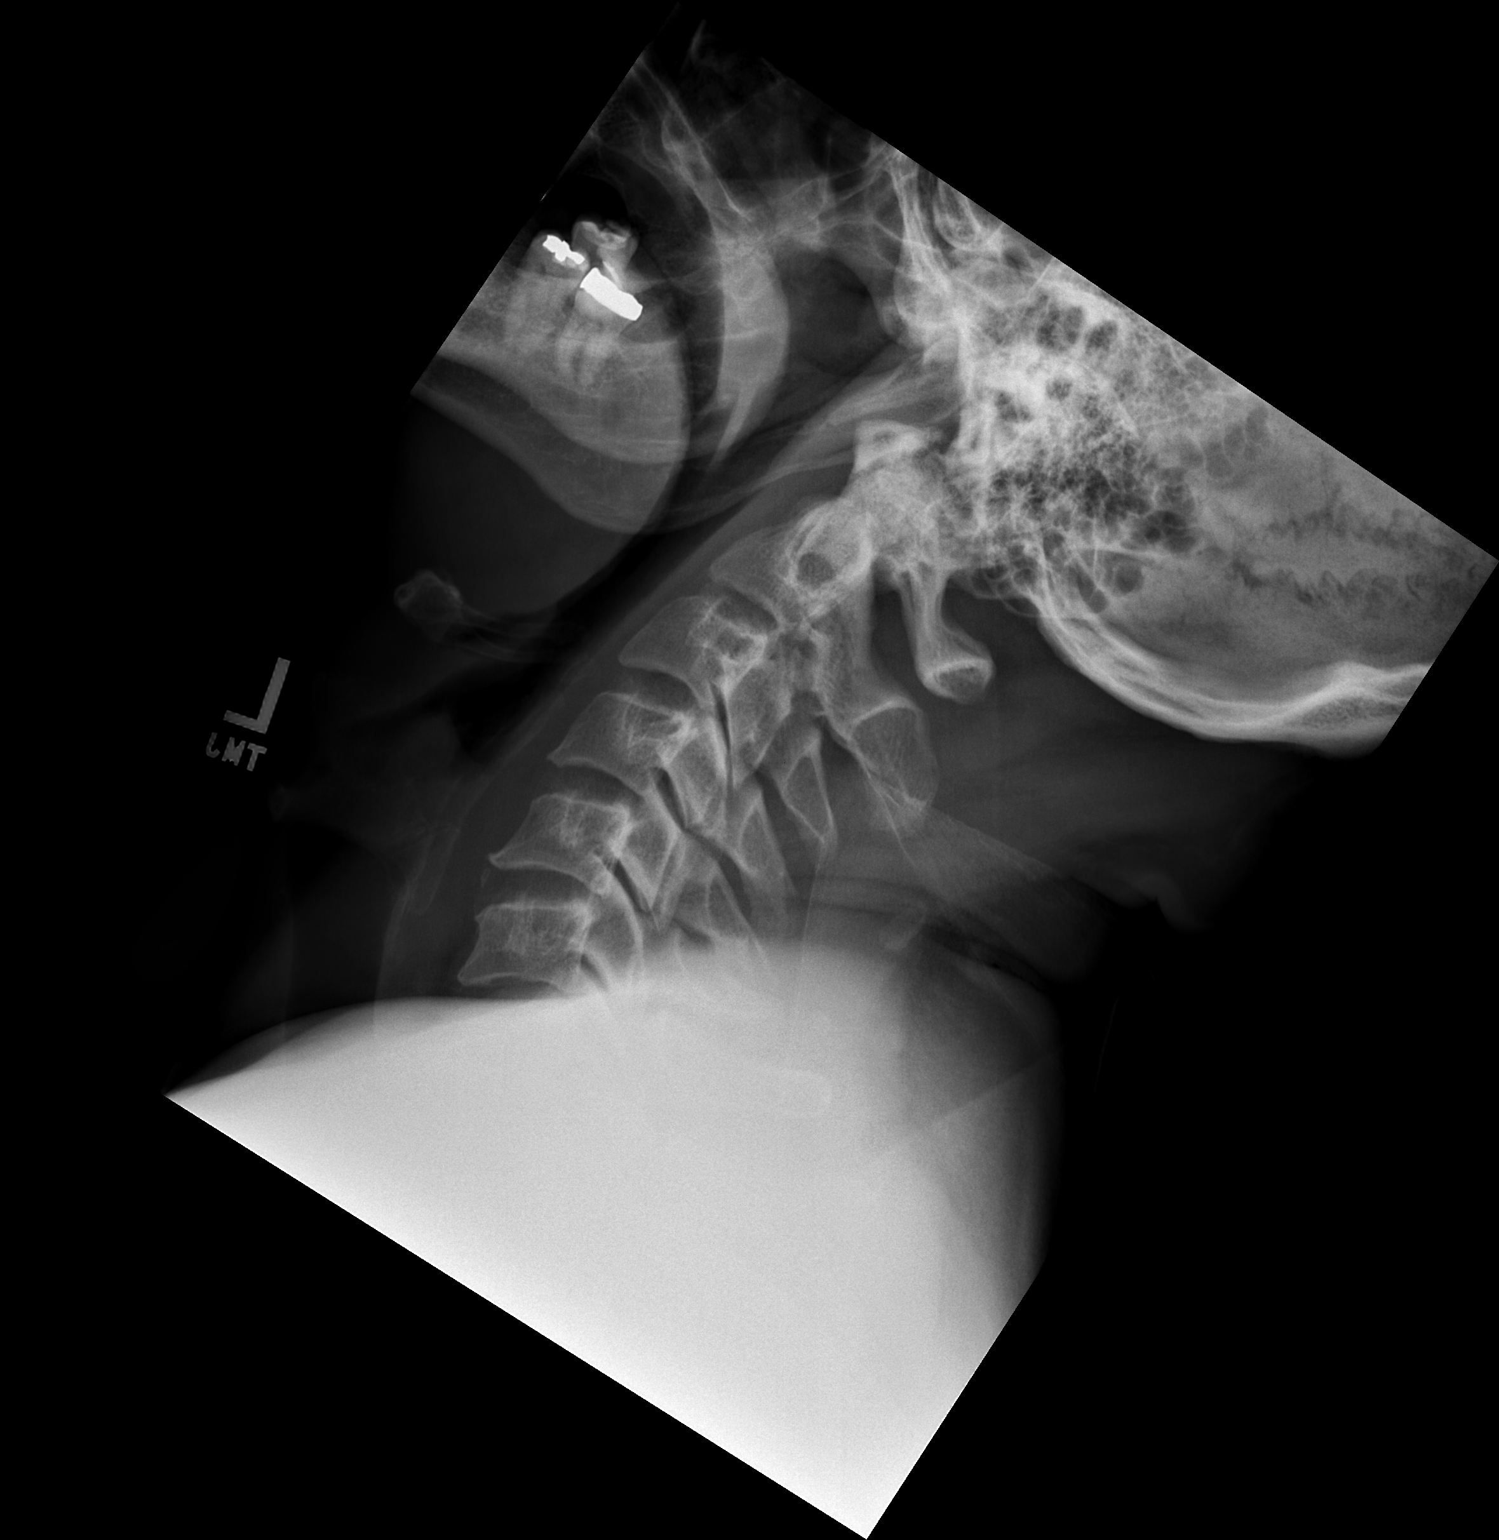

[2 of 2 positions shown; findings below may reference images not displayed]

FINDINGS: Lateral flexion and extension views were obtained.
Cervical spine demonstrates normal mobility in flexion and
extension.  There is no evidence of instability.  No fracture or
soft tissue swelling.
IMPRESSION: Normal flexion and extension views of the cervical spine.

## 2012-02-09 IMAGING — CR DG ABDOMEN 1V
1 series · 1 of 1 positions shown · non-contrast
Comparison: 02/04/2010

CLINICAL DATA: Cauda equina syndrome

ABDOMEN - 1 VIEW

[t abdomen supine]
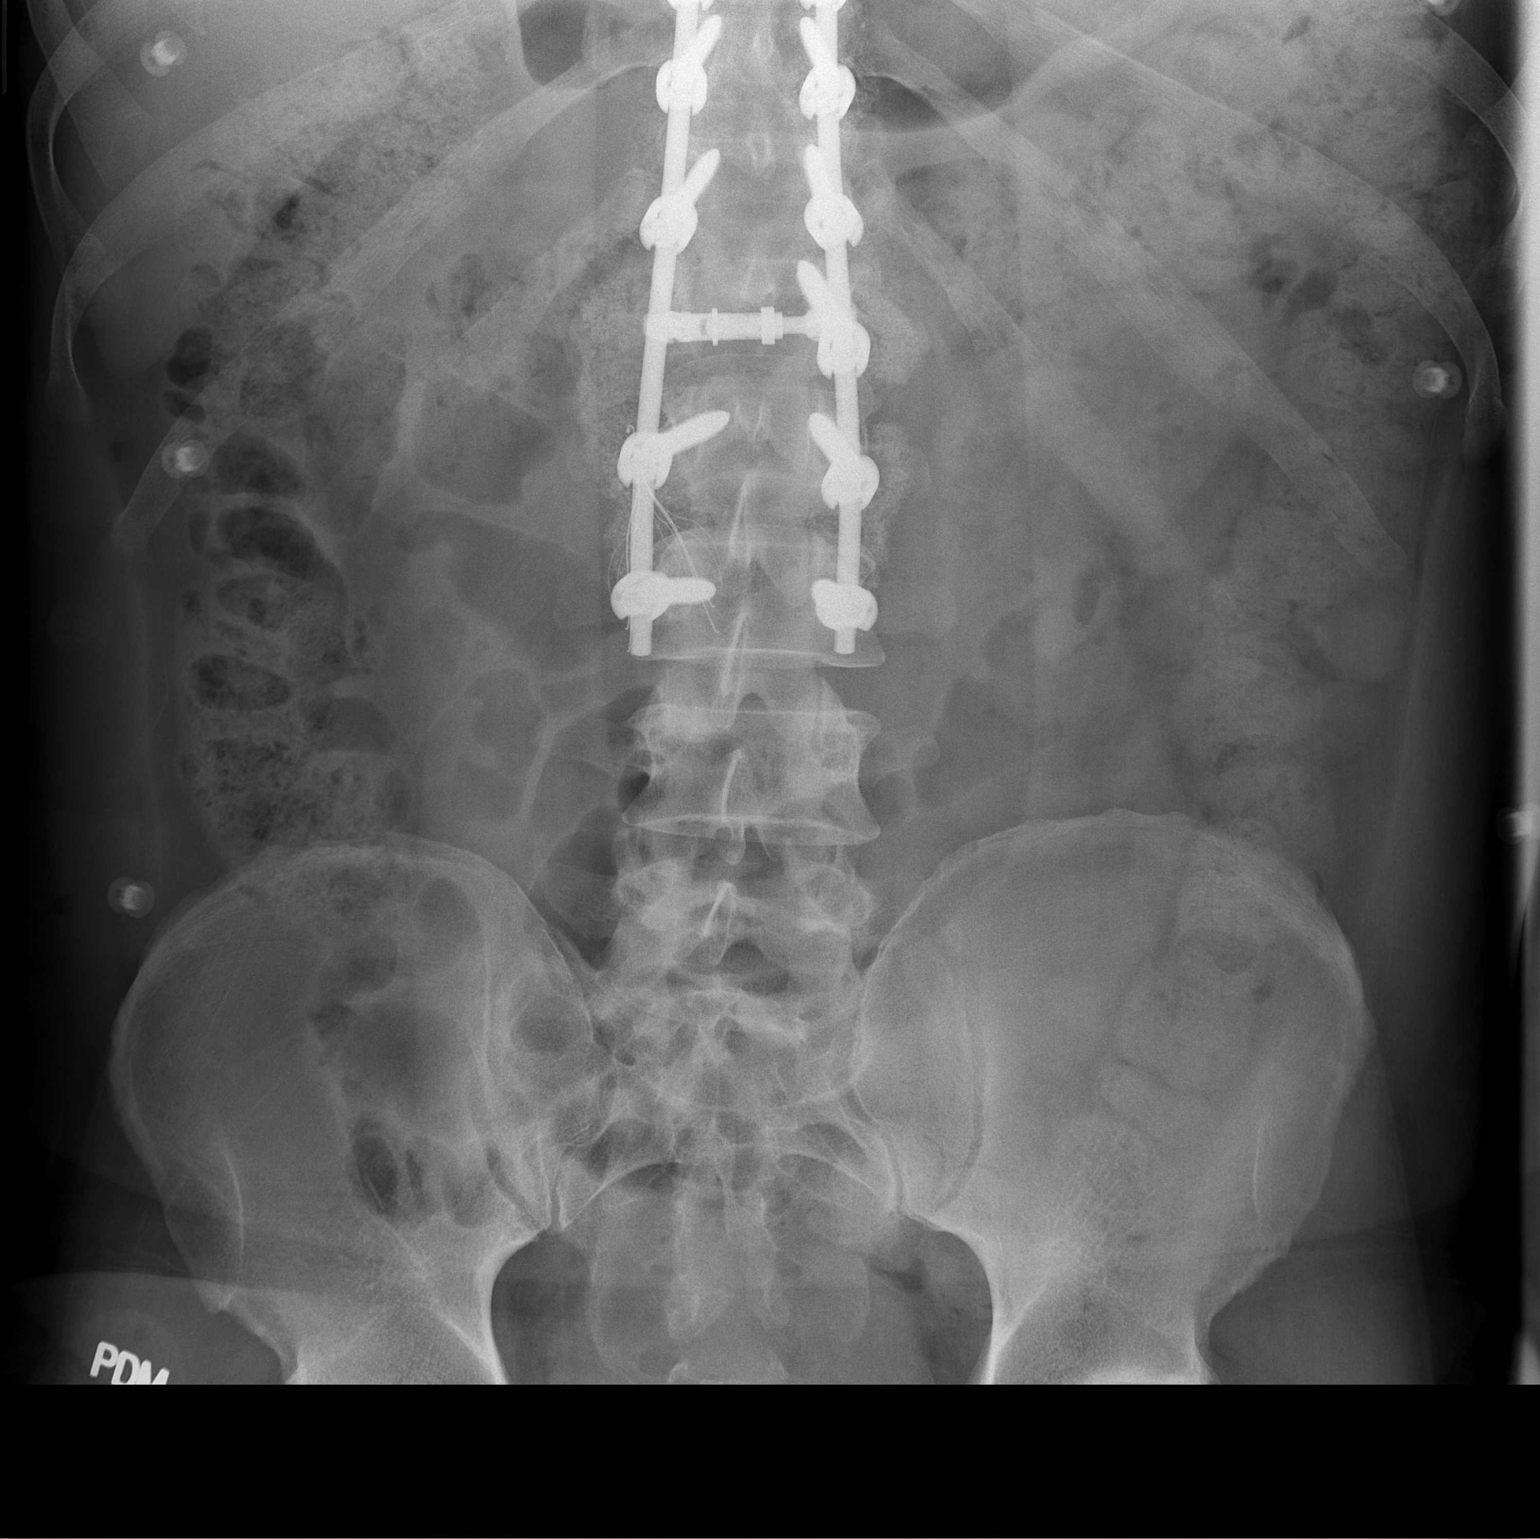

[1 of 1 positions shown; findings below may reference images not displayed]

FINDINGS: Bilateral pedicle screw and posterior rod fusion in the
lower thoracic spine extending to L3.  Hardware is in satisfactory
position.  Posterior lateral bone graft material is in place.

IVC filter is present at the L2-3 level.

Constipation with feces seen throughout the colon.  Negative for
bowel obstruction.
IMPRESSION: Constipation without bowel obstruction.

## 2014-04-14 DIAGNOSIS — E782 Mixed hyperlipidemia: Secondary | ICD-10-CM | POA: Diagnosis not present

## 2014-04-14 DIAGNOSIS — Z95828 Presence of other vascular implants and grafts: Secondary | ICD-10-CM | POA: Diagnosis not present

## 2014-04-14 DIAGNOSIS — Z0000 Encounter for general adult medical examination without abnormal findings: Secondary | ICD-10-CM | POA: Diagnosis not present

## 2014-04-14 DIAGNOSIS — Z1211 Encounter for screening for malignant neoplasm of colon: Secondary | ICD-10-CM | POA: Diagnosis not present

## 2014-04-14 DIAGNOSIS — I10 Essential (primary) hypertension: Secondary | ICD-10-CM | POA: Diagnosis not present

## 2014-04-14 DIAGNOSIS — N319 Neuromuscular dysfunction of bladder, unspecified: Secondary | ICD-10-CM | POA: Diagnosis not present

## 2014-04-14 DIAGNOSIS — G834 Cauda equina syndrome: Secondary | ICD-10-CM | POA: Diagnosis not present

## 2014-04-14 DIAGNOSIS — Z8042 Family history of malignant neoplasm of prostate: Secondary | ICD-10-CM | POA: Diagnosis not present

## 2014-07-11 DIAGNOSIS — K621 Rectal polyp: Secondary | ICD-10-CM | POA: Diagnosis not present

## 2014-07-11 DIAGNOSIS — K640 First degree hemorrhoids: Secondary | ICD-10-CM | POA: Diagnosis not present

## 2014-07-11 DIAGNOSIS — Z1211 Encounter for screening for malignant neoplasm of colon: Secondary | ICD-10-CM | POA: Diagnosis not present

## 2014-10-13 DIAGNOSIS — I10 Essential (primary) hypertension: Secondary | ICD-10-CM | POA: Diagnosis not present

## 2014-10-13 DIAGNOSIS — R202 Paresthesia of skin: Secondary | ICD-10-CM | POA: Diagnosis not present

## 2014-10-13 DIAGNOSIS — G834 Cauda equina syndrome: Secondary | ICD-10-CM | POA: Diagnosis not present

## 2014-10-13 DIAGNOSIS — Z95828 Presence of other vascular implants and grafts: Secondary | ICD-10-CM | POA: Diagnosis not present

## 2014-10-13 DIAGNOSIS — N319 Neuromuscular dysfunction of bladder, unspecified: Secondary | ICD-10-CM | POA: Diagnosis not present

## 2014-10-13 DIAGNOSIS — E782 Mixed hyperlipidemia: Secondary | ICD-10-CM | POA: Diagnosis not present

## 2014-11-11 DIAGNOSIS — Z23 Encounter for immunization: Secondary | ICD-10-CM | POA: Diagnosis not present

## 2015-05-12 DIAGNOSIS — M21372 Foot drop, left foot: Secondary | ICD-10-CM | POA: Diagnosis not present

## 2015-06-23 DIAGNOSIS — E782 Mixed hyperlipidemia: Secondary | ICD-10-CM | POA: Diagnosis not present

## 2015-06-23 DIAGNOSIS — I10 Essential (primary) hypertension: Secondary | ICD-10-CM | POA: Diagnosis not present

## 2015-06-23 DIAGNOSIS — G834 Cauda equina syndrome: Secondary | ICD-10-CM | POA: Diagnosis not present

## 2015-06-23 DIAGNOSIS — Z95828 Presence of other vascular implants and grafts: Secondary | ICD-10-CM | POA: Diagnosis not present

## 2015-06-23 DIAGNOSIS — R202 Paresthesia of skin: Secondary | ICD-10-CM | POA: Diagnosis not present

## 2015-06-23 DIAGNOSIS — N319 Neuromuscular dysfunction of bladder, unspecified: Secondary | ICD-10-CM | POA: Diagnosis not present

## 2015-08-25 DIAGNOSIS — R2689 Other abnormalities of gait and mobility: Secondary | ICD-10-CM | POA: Diagnosis not present

## 2015-08-25 DIAGNOSIS — L97512 Non-pressure chronic ulcer of other part of right foot with fat layer exposed: Secondary | ICD-10-CM | POA: Diagnosis not present

## 2015-08-25 DIAGNOSIS — S91331A Puncture wound without foreign body, right foot, initial encounter: Secondary | ICD-10-CM | POA: Diagnosis not present

## 2015-08-25 DIAGNOSIS — Z23 Encounter for immunization: Secondary | ICD-10-CM | POA: Diagnosis not present

## 2015-09-06 DIAGNOSIS — S91331D Puncture wound without foreign body, right foot, subsequent encounter: Secondary | ICD-10-CM | POA: Diagnosis not present

## 2015-09-06 DIAGNOSIS — L97512 Non-pressure chronic ulcer of other part of right foot with fat layer exposed: Secondary | ICD-10-CM | POA: Diagnosis not present

## 2015-09-06 DIAGNOSIS — R2689 Other abnormalities of gait and mobility: Secondary | ICD-10-CM | POA: Diagnosis not present

## 2015-09-20 DIAGNOSIS — S99821A Other specified injuries of right foot, initial encounter: Secondary | ICD-10-CM | POA: Diagnosis not present

## 2015-09-20 DIAGNOSIS — L97512 Non-pressure chronic ulcer of other part of right foot with fat layer exposed: Secondary | ICD-10-CM | POA: Diagnosis not present

## 2015-09-20 DIAGNOSIS — S91331A Puncture wound without foreign body, right foot, initial encounter: Secondary | ICD-10-CM | POA: Diagnosis not present

## 2015-09-20 DIAGNOSIS — M25561 Pain in right knee: Secondary | ICD-10-CM | POA: Diagnosis not present

## 2015-09-20 DIAGNOSIS — R2689 Other abnormalities of gait and mobility: Secondary | ICD-10-CM | POA: Diagnosis not present

## 2015-09-26 DIAGNOSIS — M25561 Pain in right knee: Secondary | ICD-10-CM | POA: Diagnosis not present

## 2015-09-28 DIAGNOSIS — S91331A Puncture wound without foreign body, right foot, initial encounter: Secondary | ICD-10-CM | POA: Diagnosis not present

## 2015-09-28 DIAGNOSIS — S91331D Puncture wound without foreign body, right foot, subsequent encounter: Secondary | ICD-10-CM | POA: Diagnosis not present

## 2015-09-28 DIAGNOSIS — R2689 Other abnormalities of gait and mobility: Secondary | ICD-10-CM | POA: Diagnosis not present

## 2015-09-28 DIAGNOSIS — M25561 Pain in right knee: Secondary | ICD-10-CM | POA: Diagnosis not present

## 2015-09-28 DIAGNOSIS — L97512 Non-pressure chronic ulcer of other part of right foot with fat layer exposed: Secondary | ICD-10-CM | POA: Diagnosis not present

## 2015-10-10 DIAGNOSIS — L97512 Non-pressure chronic ulcer of other part of right foot with fat layer exposed: Secondary | ICD-10-CM | POA: Diagnosis not present

## 2015-10-10 DIAGNOSIS — R2689 Other abnormalities of gait and mobility: Secondary | ICD-10-CM | POA: Diagnosis not present

## 2015-10-10 DIAGNOSIS — S91331D Puncture wound without foreign body, right foot, subsequent encounter: Secondary | ICD-10-CM | POA: Diagnosis not present

## 2015-10-18 DIAGNOSIS — S91331D Puncture wound without foreign body, right foot, subsequent encounter: Secondary | ICD-10-CM | POA: Diagnosis not present

## 2015-10-18 DIAGNOSIS — R2689 Other abnormalities of gait and mobility: Secondary | ICD-10-CM | POA: Diagnosis not present

## 2015-10-18 DIAGNOSIS — L97512 Non-pressure chronic ulcer of other part of right foot with fat layer exposed: Secondary | ICD-10-CM | POA: Diagnosis not present

## 2015-11-08 DIAGNOSIS — R2689 Other abnormalities of gait and mobility: Secondary | ICD-10-CM | POA: Diagnosis not present

## 2015-11-08 DIAGNOSIS — S91331D Puncture wound without foreign body, right foot, subsequent encounter: Secondary | ICD-10-CM | POA: Diagnosis not present

## 2016-02-06 DIAGNOSIS — Z87828 Personal history of other (healed) physical injury and trauma: Secondary | ICD-10-CM | POA: Diagnosis not present

## 2016-02-06 DIAGNOSIS — R3989 Other symptoms and signs involving the genitourinary system: Secondary | ICD-10-CM | POA: Diagnosis not present

## 2016-04-23 DIAGNOSIS — Z95828 Presence of other vascular implants and grafts: Secondary | ICD-10-CM | POA: Diagnosis not present

## 2016-04-23 DIAGNOSIS — G834 Cauda equina syndrome: Secondary | ICD-10-CM | POA: Diagnosis not present

## 2016-04-23 DIAGNOSIS — I10 Essential (primary) hypertension: Secondary | ICD-10-CM | POA: Diagnosis not present

## 2016-04-23 DIAGNOSIS — E782 Mixed hyperlipidemia: Secondary | ICD-10-CM | POA: Diagnosis not present

## 2016-04-23 DIAGNOSIS — R202 Paresthesia of skin: Secondary | ICD-10-CM | POA: Diagnosis not present

## 2016-04-23 DIAGNOSIS — N319 Neuromuscular dysfunction of bladder, unspecified: Secondary | ICD-10-CM | POA: Diagnosis not present

## 2016-10-28 DIAGNOSIS — R202 Paresthesia of skin: Secondary | ICD-10-CM | POA: Diagnosis not present

## 2016-10-28 DIAGNOSIS — E782 Mixed hyperlipidemia: Secondary | ICD-10-CM | POA: Diagnosis not present

## 2016-10-28 DIAGNOSIS — Z23 Encounter for immunization: Secondary | ICD-10-CM | POA: Diagnosis not present

## 2016-10-28 DIAGNOSIS — N319 Neuromuscular dysfunction of bladder, unspecified: Secondary | ICD-10-CM | POA: Diagnosis not present

## 2016-10-28 DIAGNOSIS — G834 Cauda equina syndrome: Secondary | ICD-10-CM | POA: Diagnosis not present

## 2016-10-28 DIAGNOSIS — Z0000 Encounter for general adult medical examination without abnormal findings: Secondary | ICD-10-CM | POA: Diagnosis not present

## 2016-10-28 DIAGNOSIS — Z95828 Presence of other vascular implants and grafts: Secondary | ICD-10-CM | POA: Diagnosis not present

## 2016-10-28 DIAGNOSIS — M21372 Foot drop, left foot: Secondary | ICD-10-CM | POA: Diagnosis not present

## 2016-10-28 DIAGNOSIS — Z1159 Encounter for screening for other viral diseases: Secondary | ICD-10-CM | POA: Diagnosis not present

## 2016-10-28 DIAGNOSIS — Z125 Encounter for screening for malignant neoplasm of prostate: Secondary | ICD-10-CM | POA: Diagnosis not present

## 2016-10-28 DIAGNOSIS — I10 Essential (primary) hypertension: Secondary | ICD-10-CM | POA: Diagnosis not present

## 2017-05-13 DIAGNOSIS — I10 Essential (primary) hypertension: Secondary | ICD-10-CM | POA: Diagnosis not present

## 2017-05-13 DIAGNOSIS — G834 Cauda equina syndrome: Secondary | ICD-10-CM | POA: Diagnosis not present

## 2017-05-13 DIAGNOSIS — M21372 Foot drop, left foot: Secondary | ICD-10-CM | POA: Diagnosis not present

## 2017-05-13 DIAGNOSIS — N319 Neuromuscular dysfunction of bladder, unspecified: Secondary | ICD-10-CM | POA: Diagnosis not present

## 2017-05-13 DIAGNOSIS — Z95828 Presence of other vascular implants and grafts: Secondary | ICD-10-CM | POA: Diagnosis not present

## 2017-05-13 DIAGNOSIS — E782 Mixed hyperlipidemia: Secondary | ICD-10-CM | POA: Diagnosis not present

## 2017-05-13 DIAGNOSIS — R202 Paresthesia of skin: Secondary | ICD-10-CM | POA: Diagnosis not present

## 2017-06-11 DIAGNOSIS — N319 Neuromuscular dysfunction of bladder, unspecified: Secondary | ICD-10-CM | POA: Diagnosis not present

## 2017-06-11 DIAGNOSIS — G834 Cauda equina syndrome: Secondary | ICD-10-CM | POA: Diagnosis not present

## 2017-09-19 DIAGNOSIS — S81811A Laceration without foreign body, right lower leg, initial encounter: Secondary | ICD-10-CM | POA: Diagnosis not present

## 2017-09-19 DIAGNOSIS — M21372 Foot drop, left foot: Secondary | ICD-10-CM | POA: Diagnosis not present

## 2017-10-01 DIAGNOSIS — M21372 Foot drop, left foot: Secondary | ICD-10-CM | POA: Diagnosis not present

## 2017-12-18 DIAGNOSIS — Z23 Encounter for immunization: Secondary | ICD-10-CM | POA: Diagnosis not present

## 2017-12-18 DIAGNOSIS — G834 Cauda equina syndrome: Secondary | ICD-10-CM | POA: Diagnosis not present

## 2017-12-18 DIAGNOSIS — R252 Cramp and spasm: Secondary | ICD-10-CM | POA: Diagnosis not present

## 2017-12-18 DIAGNOSIS — Z125 Encounter for screening for malignant neoplasm of prostate: Secondary | ICD-10-CM | POA: Diagnosis not present

## 2017-12-18 DIAGNOSIS — R202 Paresthesia of skin: Secondary | ICD-10-CM | POA: Diagnosis not present

## 2017-12-18 DIAGNOSIS — N319 Neuromuscular dysfunction of bladder, unspecified: Secondary | ICD-10-CM | POA: Diagnosis not present

## 2017-12-18 DIAGNOSIS — Z0000 Encounter for general adult medical examination without abnormal findings: Secondary | ICD-10-CM | POA: Diagnosis not present

## 2017-12-18 DIAGNOSIS — M21372 Foot drop, left foot: Secondary | ICD-10-CM | POA: Diagnosis not present

## 2017-12-18 DIAGNOSIS — I10 Essential (primary) hypertension: Secondary | ICD-10-CM | POA: Diagnosis not present

## 2017-12-18 DIAGNOSIS — E782 Mixed hyperlipidemia: Secondary | ICD-10-CM | POA: Diagnosis not present

## 2017-12-18 DIAGNOSIS — Z95828 Presence of other vascular implants and grafts: Secondary | ICD-10-CM | POA: Diagnosis not present

## 2018-10-27 DIAGNOSIS — G834 Cauda equina syndrome: Secondary | ICD-10-CM | POA: Diagnosis not present

## 2018-10-27 DIAGNOSIS — Z23 Encounter for immunization: Secondary | ICD-10-CM | POA: Diagnosis not present

## 2018-10-27 DIAGNOSIS — M21372 Foot drop, left foot: Secondary | ICD-10-CM | POA: Diagnosis not present

## 2018-11-18 DIAGNOSIS — M21372 Foot drop, left foot: Secondary | ICD-10-CM | POA: Diagnosis not present

## 2018-12-28 DIAGNOSIS — L03011 Cellulitis of right finger: Secondary | ICD-10-CM | POA: Diagnosis not present

## 2018-12-28 DIAGNOSIS — S60459A Superficial foreign body of unspecified finger, initial encounter: Secondary | ICD-10-CM | POA: Diagnosis not present

## 2018-12-29 DIAGNOSIS — S60450A Superficial foreign body of right index finger, initial encounter: Secondary | ICD-10-CM | POA: Diagnosis not present

## 2019-01-04 DIAGNOSIS — I10 Essential (primary) hypertension: Secondary | ICD-10-CM | POA: Diagnosis not present

## 2019-01-04 DIAGNOSIS — Z95828 Presence of other vascular implants and grafts: Secondary | ICD-10-CM | POA: Diagnosis not present

## 2019-01-04 DIAGNOSIS — R202 Paresthesia of skin: Secondary | ICD-10-CM | POA: Diagnosis not present

## 2019-01-04 DIAGNOSIS — Z0000 Encounter for general adult medical examination without abnormal findings: Secondary | ICD-10-CM | POA: Diagnosis not present

## 2019-01-04 DIAGNOSIS — M21372 Foot drop, left foot: Secondary | ICD-10-CM | POA: Diagnosis not present

## 2019-01-04 DIAGNOSIS — E782 Mixed hyperlipidemia: Secondary | ICD-10-CM | POA: Diagnosis not present

## 2019-01-04 DIAGNOSIS — G834 Cauda equina syndrome: Secondary | ICD-10-CM | POA: Diagnosis not present

## 2019-01-04 DIAGNOSIS — Z1389 Encounter for screening for other disorder: Secondary | ICD-10-CM | POA: Diagnosis not present

## 2019-01-04 DIAGNOSIS — N319 Neuromuscular dysfunction of bladder, unspecified: Secondary | ICD-10-CM | POA: Diagnosis not present

## 2019-01-21 DIAGNOSIS — Z125 Encounter for screening for malignant neoplasm of prostate: Secondary | ICD-10-CM | POA: Diagnosis not present

## 2019-01-21 DIAGNOSIS — E782 Mixed hyperlipidemia: Secondary | ICD-10-CM | POA: Diagnosis not present

## 2019-03-03 ENCOUNTER — Ambulatory Visit: Payer: Medicare HMO | Attending: Internal Medicine

## 2019-03-03 DIAGNOSIS — Z20822 Contact with and (suspected) exposure to covid-19: Secondary | ICD-10-CM | POA: Diagnosis not present

## 2019-03-04 LAB — NOVEL CORONAVIRUS, NAA: SARS-CoV-2, NAA: NOT DETECTED

## 2019-04-14 DIAGNOSIS — Z23 Encounter for immunization: Secondary | ICD-10-CM | POA: Diagnosis not present

## 2019-05-17 DIAGNOSIS — Z23 Encounter for immunization: Secondary | ICD-10-CM | POA: Diagnosis not present

## 2019-11-08 DIAGNOSIS — Z01110 Encounter for hearing examination following failed hearing screening: Secondary | ICD-10-CM | POA: Diagnosis not present

## 2020-01-17 DIAGNOSIS — Z95828 Presence of other vascular implants and grafts: Secondary | ICD-10-CM | POA: Diagnosis not present

## 2020-01-17 DIAGNOSIS — E782 Mixed hyperlipidemia: Secondary | ICD-10-CM | POA: Diagnosis not present

## 2020-01-17 DIAGNOSIS — Z0000 Encounter for general adult medical examination without abnormal findings: Secondary | ICD-10-CM | POA: Diagnosis not present

## 2020-01-17 DIAGNOSIS — M21372 Foot drop, left foot: Secondary | ICD-10-CM | POA: Diagnosis not present

## 2020-01-17 DIAGNOSIS — N319 Neuromuscular dysfunction of bladder, unspecified: Secondary | ICD-10-CM | POA: Diagnosis not present

## 2020-01-17 DIAGNOSIS — Z1389 Encounter for screening for other disorder: Secondary | ICD-10-CM | POA: Diagnosis not present

## 2020-01-17 DIAGNOSIS — G834 Cauda equina syndrome: Secondary | ICD-10-CM | POA: Diagnosis not present

## 2020-01-17 DIAGNOSIS — Z1211 Encounter for screening for malignant neoplasm of colon: Secondary | ICD-10-CM | POA: Diagnosis not present

## 2020-01-17 DIAGNOSIS — Z125 Encounter for screening for malignant neoplasm of prostate: Secondary | ICD-10-CM | POA: Diagnosis not present

## 2020-01-17 DIAGNOSIS — I10 Essential (primary) hypertension: Secondary | ICD-10-CM | POA: Diagnosis not present

## 2020-06-19 DIAGNOSIS — Z20828 Contact with and (suspected) exposure to other viral communicable diseases: Secondary | ICD-10-CM | POA: Diagnosis not present

## 2020-11-08 DIAGNOSIS — Z23 Encounter for immunization: Secondary | ICD-10-CM | POA: Diagnosis not present

## 2021-01-23 DIAGNOSIS — I10 Essential (primary) hypertension: Secondary | ICD-10-CM | POA: Diagnosis not present

## 2021-01-23 DIAGNOSIS — N319 Neuromuscular dysfunction of bladder, unspecified: Secondary | ICD-10-CM | POA: Diagnosis not present

## 2021-01-23 DIAGNOSIS — M21372 Foot drop, left foot: Secondary | ICD-10-CM | POA: Diagnosis not present

## 2021-01-23 DIAGNOSIS — M545 Low back pain, unspecified: Secondary | ICD-10-CM | POA: Diagnosis not present

## 2021-01-23 DIAGNOSIS — Z125 Encounter for screening for malignant neoplasm of prostate: Secondary | ICD-10-CM | POA: Diagnosis not present

## 2021-01-23 DIAGNOSIS — Z8616 Personal history of COVID-19: Secondary | ICD-10-CM | POA: Diagnosis not present

## 2021-01-23 DIAGNOSIS — Z0000 Encounter for general adult medical examination without abnormal findings: Secondary | ICD-10-CM | POA: Diagnosis not present

## 2021-01-23 DIAGNOSIS — Z95828 Presence of other vascular implants and grafts: Secondary | ICD-10-CM | POA: Diagnosis not present

## 2021-01-23 DIAGNOSIS — G834 Cauda equina syndrome: Secondary | ICD-10-CM | POA: Diagnosis not present

## 2021-01-23 DIAGNOSIS — E782 Mixed hyperlipidemia: Secondary | ICD-10-CM | POA: Diagnosis not present

## 2021-11-22 DIAGNOSIS — Z23 Encounter for immunization: Secondary | ICD-10-CM | POA: Diagnosis not present

## 2022-01-25 DIAGNOSIS — Z0000 Encounter for general adult medical examination without abnormal findings: Secondary | ICD-10-CM | POA: Diagnosis not present

## 2022-01-25 DIAGNOSIS — I10 Essential (primary) hypertension: Secondary | ICD-10-CM | POA: Diagnosis not present

## 2022-01-25 DIAGNOSIS — E782 Mixed hyperlipidemia: Secondary | ICD-10-CM | POA: Diagnosis not present

## 2022-01-25 DIAGNOSIS — Z8249 Family history of ischemic heart disease and other diseases of the circulatory system: Secondary | ICD-10-CM | POA: Diagnosis not present

## 2022-01-25 DIAGNOSIS — Z95828 Presence of other vascular implants and grafts: Secondary | ICD-10-CM | POA: Diagnosis not present

## 2022-01-25 DIAGNOSIS — G839 Paralytic syndrome, unspecified: Secondary | ICD-10-CM | POA: Diagnosis not present

## 2022-01-25 DIAGNOSIS — Z125 Encounter for screening for malignant neoplasm of prostate: Secondary | ICD-10-CM | POA: Diagnosis not present

## 2022-01-25 DIAGNOSIS — N319 Neuromuscular dysfunction of bladder, unspecified: Secondary | ICD-10-CM | POA: Diagnosis not present

## 2022-01-29 ENCOUNTER — Other Ambulatory Visit: Payer: Self-pay | Admitting: Physician Assistant

## 2022-01-29 DIAGNOSIS — Z8249 Family history of ischemic heart disease and other diseases of the circulatory system: Secondary | ICD-10-CM

## 2022-02-20 DIAGNOSIS — N390 Urinary tract infection, site not specified: Secondary | ICD-10-CM | POA: Diagnosis not present

## 2022-02-20 DIAGNOSIS — J209 Acute bronchitis, unspecified: Secondary | ICD-10-CM | POA: Diagnosis not present

## 2022-02-20 DIAGNOSIS — N319 Neuromuscular dysfunction of bladder, unspecified: Secondary | ICD-10-CM | POA: Diagnosis not present

## 2022-02-20 DIAGNOSIS — R399 Unspecified symptoms and signs involving the genitourinary system: Secondary | ICD-10-CM | POA: Diagnosis not present

## 2022-03-05 DIAGNOSIS — M25572 Pain in left ankle and joints of left foot: Secondary | ICD-10-CM | POA: Diagnosis not present

## 2022-03-05 DIAGNOSIS — S82102A Unspecified fracture of upper end of left tibia, initial encounter for closed fracture: Secondary | ICD-10-CM | POA: Diagnosis not present

## 2022-03-06 ENCOUNTER — Ambulatory Visit
Admission: RE | Admit: 2022-03-06 | Discharge: 2022-03-06 | Disposition: A | Discharge status: Home / Self Care | Payer: Medicare HMO | Source: Ambulatory Visit | Attending: Medical | Admitting: Medical

## 2022-03-06 ENCOUNTER — Ambulatory Visit
Admission: RE | Admit: 2022-03-06 | Discharge: 2022-03-06 | Disposition: A | Discharge status: Home / Self Care | Payer: Medicare HMO | Source: Ambulatory Visit | Attending: Physician Assistant | Admitting: Physician Assistant

## 2022-03-06 ENCOUNTER — Other Ambulatory Visit: Payer: Self-pay | Admitting: Medical

## 2022-03-06 DIAGNOSIS — M7989 Other specified soft tissue disorders: Secondary | ICD-10-CM | POA: Diagnosis not present

## 2022-03-06 DIAGNOSIS — M25562 Pain in left knee: Secondary | ICD-10-CM

## 2022-03-06 DIAGNOSIS — I700 Atherosclerosis of aorta: Secondary | ICD-10-CM | POA: Diagnosis not present

## 2022-03-06 DIAGNOSIS — Z8249 Family history of ischemic heart disease and other diseases of the circulatory system: Secondary | ICD-10-CM

## 2022-03-07 ENCOUNTER — Other Ambulatory Visit: Payer: Medicare HMO

## 2022-03-07 DIAGNOSIS — M25562 Pain in left knee: Secondary | ICD-10-CM | POA: Diagnosis not present

## 2022-03-07 DIAGNOSIS — M25572 Pain in left ankle and joints of left foot: Secondary | ICD-10-CM | POA: Diagnosis not present

## 2022-03-07 DIAGNOSIS — S82132S Displaced fracture of medial condyle of left tibia, sequela: Secondary | ICD-10-CM | POA: Diagnosis not present

## 2022-03-08 ENCOUNTER — Encounter (HOSPITAL_COMMUNITY): Payer: Self-pay | Admitting: Orthopedic Surgery

## 2022-03-08 NOTE — Congregational Nurse Program (Signed)
PCP - Dr Marijo File Cardiologist - n/a  Chest x-ray - n/a EKG - DOS Stress Test - n/a ECHO - n/a Cardiac Cath - n/a  ICD Pacemaker/Loop - n/a  Sleep Study -  n/a CPAP - none  ERAS: Clear liquids til 11 am DOS.  STOP now taking any Aspirin (unless otherwise instructed by your surgeon), Aleve, Naproxen, Ibuprofen, Motrin, Advil, Goody's, BC's, all herbal medications, fish oil, and all vitamins.   Coronavirus Screening Do you have any of the following symptoms:  Cough yes/no: No Fever (>100.91F)  yes/no: No Runny nose yes/no: No Sore throat yes/no: No Difficulty breathing/shortness of breath  yes/no: No  Have you traveled in the last 14 days and where? yes/no: No  Patient verbalized understanding of instructions that were given via phone.

## 2022-03-11 ENCOUNTER — Encounter (HOSPITAL_COMMUNITY)
Admission: RE | Disposition: A | Discharge status: Home Health | Payer: Self-pay | Source: Home / Self Care | Attending: Orthopedic Surgery

## 2022-03-11 ENCOUNTER — Other Ambulatory Visit: Payer: Self-pay

## 2022-03-11 ENCOUNTER — Inpatient Hospital Stay (HOSPITAL_COMMUNITY): Payer: Medicare HMO | Admitting: Certified Registered Nurse Anesthetist

## 2022-03-11 ENCOUNTER — Inpatient Hospital Stay (HOSPITAL_COMMUNITY)
Admission: RE | Admit: 2022-03-11 | Discharge: 2022-03-13 | DRG: 494 | Disposition: A | Discharge status: Home Health | Payer: Medicare HMO | Attending: Orthopedic Surgery | Admitting: Orthopedic Surgery

## 2022-03-11 ENCOUNTER — Inpatient Hospital Stay (HOSPITAL_COMMUNITY): Payer: Medicare HMO

## 2022-03-11 ENCOUNTER — Encounter (HOSPITAL_COMMUNITY): Payer: Self-pay | Admitting: Orthopedic Surgery

## 2022-03-11 DIAGNOSIS — G629 Polyneuropathy, unspecified: Secondary | ICD-10-CM | POA: Diagnosis not present

## 2022-03-11 DIAGNOSIS — S82142A Displaced bicondylar fracture of left tibia, initial encounter for closed fracture: Secondary | ICD-10-CM | POA: Diagnosis not present

## 2022-03-11 DIAGNOSIS — Z7982 Long term (current) use of aspirin: Secondary | ICD-10-CM

## 2022-03-11 DIAGNOSIS — I10 Essential (primary) hypertension: Secondary | ICD-10-CM

## 2022-03-11 DIAGNOSIS — Z472 Encounter for removal of internal fixation device: Secondary | ICD-10-CM

## 2022-03-11 DIAGNOSIS — Z8249 Family history of ischemic heart disease and other diseases of the circulatory system: Secondary | ICD-10-CM

## 2022-03-11 DIAGNOSIS — Z4789 Encounter for other orthopedic aftercare: Secondary | ICD-10-CM | POA: Diagnosis not present

## 2022-03-11 DIAGNOSIS — Z79899 Other long term (current) drug therapy: Secondary | ICD-10-CM | POA: Diagnosis not present

## 2022-03-11 DIAGNOSIS — S82132D Displaced fracture of medial condyle of left tibia, subsequent encounter for closed fracture with routine healing: Secondary | ICD-10-CM | POA: Diagnosis not present

## 2022-03-11 DIAGNOSIS — Z809 Family history of malignant neoplasm, unspecified: Secondary | ICD-10-CM

## 2022-03-11 DIAGNOSIS — E785 Hyperlipidemia, unspecified: Secondary | ICD-10-CM | POA: Diagnosis not present

## 2022-03-11 DIAGNOSIS — M21372 Foot drop, left foot: Secondary | ICD-10-CM | POA: Diagnosis present

## 2022-03-11 DIAGNOSIS — W19XXXA Unspecified fall, initial encounter: Secondary | ICD-10-CM | POA: Diagnosis present

## 2022-03-11 HISTORY — PX: HARDWARE REMOVAL: SHX979

## 2022-03-11 HISTORY — PX: ORIF TIBIA PLATEAU: SHX2132

## 2022-03-11 LAB — CBC
HCT: 35.1 % — ABNORMAL LOW (ref 39.0–52.0)
Hemoglobin: 12.2 g/dL — ABNORMAL LOW (ref 13.0–17.0)
MCH: 30.4 pg (ref 26.0–34.0)
MCHC: 34.8 g/dL (ref 30.0–36.0)
MCV: 87.5 fL (ref 80.0–100.0)
Platelets: 262 10*3/uL (ref 150–400)
RBC: 4.01 MIL/uL — ABNORMAL LOW (ref 4.22–5.81)
RDW: 12 % (ref 11.5–15.5)
WBC: 8.3 10*3/uL (ref 4.0–10.5)
nRBC: 0 % (ref 0.0–0.2)

## 2022-03-11 LAB — BASIC METABOLIC PANEL
Anion gap: 9 (ref 5–15)
BUN: 13 mg/dL (ref 8–23)
CO2: 23 mmol/L (ref 22–32)
Calcium: 8.7 mg/dL — ABNORMAL LOW (ref 8.9–10.3)
Chloride: 103 mmol/L (ref 98–111)
Creatinine, Ser: 0.94 mg/dL (ref 0.61–1.24)
GFR, Estimated: >60 mL/min (ref >60)
Glucose, Bld: 117 mg/dL — ABNORMAL HIGH (ref 70–99)
Potassium: 4 mmol/L (ref 3.5–5.1)
Sodium: 135 mmol/L (ref 135–145)

## 2022-03-11 SURGERY — OPEN REDUCTION INTERNAL FIXATION (ORIF) TIBIAL PLATEAU
Anesthesia: General | Laterality: Left

## 2022-03-11 MED ORDER — VANCOMYCIN HCL 1000 MG IV SOLR
INTRAVENOUS | Status: DC | PRN
  Administered 2022-03-11: 1000 mg

## 2022-03-11 MED ORDER — FENTANYL CITRATE (PF) 100 MCG/2ML IJ SOLN
INTRAMUSCULAR | Status: AC
  Filled 2022-03-11: qty 2

## 2022-03-11 MED ORDER — 0.9 % SODIUM CHLORIDE (POUR BTL) OPTIME
TOPICAL | Status: DC | PRN
  Administered 2022-03-11: 1000 mL

## 2022-03-11 MED ORDER — HYDROCODONE-ACETAMINOPHEN 7.5-325 MG PO TABS
1.0000 | ORAL_TABLET | ORAL | Status: DC | PRN
  Administered 2022-03-12 – 2022-03-13 (×5): 2 via ORAL
  Filled 2022-03-11 (×6): qty 2

## 2022-03-11 MED ORDER — MORPHINE SULFATE (PF) 2 MG/ML IV SOLN: 0.5000 mg | INTRAVENOUS | Status: DC | PRN

## 2022-03-11 MED ORDER — FENTANYL CITRATE (PF) 100 MCG/2ML IJ SOLN
25.0000 ug | INTRAMUSCULAR | Status: DC | PRN
  Administered 2022-03-11: 50 ug via INTRAVENOUS

## 2022-03-11 MED ORDER — LIDOCAINE 2% (20 MG/ML) 5 ML SYRINGE
INTRAMUSCULAR | Status: DC | PRN
  Administered 2022-03-11: 60 mg via INTRAVENOUS

## 2022-03-11 MED ORDER — PROPOFOL 10 MG/ML IV BOLUS
INTRAVENOUS | Status: AC
  Filled 2022-03-11: qty 20

## 2022-03-11 MED ORDER — ACETAMINOPHEN 10 MG/ML IV SOLN
INTRAVENOUS | Status: DC | PRN
  Administered 2022-03-11: 1000 mg via INTRAVENOUS

## 2022-03-11 MED ORDER — AMLODIPINE BESYLATE 10 MG PO TABS
10.0000 mg | ORAL_TABLET | Freq: Every morning | ORAL | Status: DC
  Administered 2022-03-12 – 2022-03-13 (×2): 10 mg via ORAL
  Filled 2022-03-11 (×2): qty 1

## 2022-03-11 MED ORDER — OXYCODONE HCL 5 MG PO TABS
ORAL_TABLET | ORAL | Status: AC
  Filled 2022-03-11: qty 1

## 2022-03-11 MED ORDER — DEXAMETHASONE SODIUM PHOSPHATE 10 MG/ML IJ SOLN
INTRAMUSCULAR | Status: DC | PRN
  Administered 2022-03-11: 10 mg via INTRAVENOUS

## 2022-03-11 MED ORDER — FENTANYL CITRATE (PF) 250 MCG/5ML IJ SOLN
INTRAMUSCULAR | Status: AC
  Filled 2022-03-11: qty 5

## 2022-03-11 MED ORDER — ONDANSETRON HCL 4 MG/2ML IJ SOLN
INTRAMUSCULAR | Status: DC | PRN
  Administered 2022-03-11: 4 mg via INTRAVENOUS

## 2022-03-11 MED ORDER — ORAL CARE MOUTH RINSE: 15.0000 mL | Freq: Once | OROMUCOSAL | Status: AC

## 2022-03-11 MED ORDER — ACETAMINOPHEN 325 MG PO TABS: 325.0000 mg | ORAL_TABLET | Freq: Four times a day (QID) | ORAL | Status: DC | PRN

## 2022-03-11 MED ORDER — CHLORHEXIDINE GLUCONATE 0.12 % MT SOLN: 15.0000 mL | Freq: Once | OROMUCOSAL | Status: AC

## 2022-03-11 MED ORDER — DEXAMETHASONE SODIUM PHOSPHATE 10 MG/ML IJ SOLN
INTRAMUSCULAR | Status: AC
  Filled 2022-03-11: qty 1

## 2022-03-11 MED ORDER — VANCOMYCIN HCL 1000 MG IV SOLR
INTRAVENOUS | Status: AC
  Filled 2022-03-11: qty 20

## 2022-03-11 MED ORDER — KETAMINE HCL 50 MG/5ML IJ SOSY
PREFILLED_SYRINGE | INTRAMUSCULAR | Status: AC
  Filled 2022-03-11: qty 5

## 2022-03-11 MED ORDER — SUGAMMADEX SODIUM 200 MG/2ML IV SOLN
INTRAVENOUS | Status: DC | PRN
  Administered 2022-03-11: 200 mg via INTRAVENOUS

## 2022-03-11 MED ORDER — ONDANSETRON HCL 4 MG/2ML IJ SOLN
INTRAMUSCULAR | Status: AC
  Filled 2022-03-11: qty 2

## 2022-03-11 MED ORDER — TRANEXAMIC ACID-NACL 1000-0.7 MG/100ML-% IV SOLN
1000.0000 mg | Freq: Once | INTRAVENOUS | Status: AC
  Administered 2022-03-11: 1000 mg via INTRAVENOUS
  Filled 2022-03-11: qty 100

## 2022-03-11 MED ORDER — METOCLOPRAMIDE HCL 5 MG/ML IJ SOLN: 5.0000 mg | Freq: Three times a day (TID) | INTRAMUSCULAR | Status: DC | PRN

## 2022-03-11 MED ORDER — ONDANSETRON HCL 4 MG PO TABS: 4.0000 mg | ORAL_TABLET | Freq: Four times a day (QID) | ORAL | Status: DC | PRN

## 2022-03-11 MED ORDER — PHENYLEPHRINE 80 MCG/ML (10ML) SYRINGE FOR IV PUSH (FOR BLOOD PRESSURE SUPPORT)
PREFILLED_SYRINGE | INTRAVENOUS | Status: DC | PRN
  Administered 2022-03-11: 80 ug via INTRAVENOUS

## 2022-03-11 MED ORDER — CEFAZOLIN SODIUM-DEXTROSE 1-4 GM/50ML-% IV SOLN
1.0000 g | Freq: Four times a day (QID) | INTRAVENOUS | Status: AC
  Administered 2022-03-11 – 2022-03-12 (×3): 1 g via INTRAVENOUS
  Filled 2022-03-11 (×3): qty 50

## 2022-03-11 MED ORDER — PROPOFOL 10 MG/ML IV BOLUS
INTRAVENOUS | Status: DC | PRN
  Administered 2022-03-11: 150 mg via INTRAVENOUS

## 2022-03-11 MED ORDER — CEFAZOLIN SODIUM-DEXTROSE 2-4 GM/100ML-% IV SOLN
2.0000 g | INTRAVENOUS | Status: AC
  Administered 2022-03-11: 2 g via INTRAVENOUS

## 2022-03-11 MED ORDER — MIDAZOLAM HCL 2 MG/2ML IJ SOLN
INTRAMUSCULAR | Status: AC
  Filled 2022-03-11: qty 2

## 2022-03-11 MED ORDER — DOCUSATE SODIUM 100 MG PO CAPS
100.0000 mg | ORAL_CAPSULE | Freq: Two times a day (BID) | ORAL | Status: DC
  Administered 2022-03-11 – 2022-03-12 (×2): 100 mg via ORAL
  Filled 2022-03-11 (×3): qty 1

## 2022-03-11 MED ORDER — ACETAMINOPHEN 500 MG PO TABS
500.0000 mg | ORAL_TABLET | Freq: Four times a day (QID) | ORAL | Status: AC
  Administered 2022-03-11 – 2022-03-12 (×2): 500 mg via ORAL
  Filled 2022-03-11 (×2): qty 1

## 2022-03-11 MED ORDER — ROCURONIUM BROMIDE 10 MG/ML (PF) SYRINGE
PREFILLED_SYRINGE | INTRAVENOUS | Status: DC | PRN
  Administered 2022-03-11 (×2): 50 mg via INTRAVENOUS
  Administered 2022-03-11: 30 mg via INTRAVENOUS

## 2022-03-11 MED ORDER — KETAMINE HCL 10 MG/ML IJ SOLN
INTRAMUSCULAR | Status: DC | PRN
  Administered 2022-03-11: 30 mg via INTRAVENOUS

## 2022-03-11 MED ORDER — CHLORHEXIDINE GLUCONATE 0.12 % MT SOLN
OROMUCOSAL | Status: AC
  Administered 2022-03-11: 15 mL via OROMUCOSAL
  Filled 2022-03-11: qty 15

## 2022-03-11 MED ORDER — LACTATED RINGERS IV SOLN: INTRAVENOUS | Status: DC

## 2022-03-11 MED ORDER — DEXMEDETOMIDINE HCL IN NACL 80 MCG/20ML IV SOLN
INTRAVENOUS | Status: DC | PRN
  Administered 2022-03-11: 10 ug via INTRAVENOUS

## 2022-03-11 MED ORDER — FENTANYL CITRATE (PF) 250 MCG/5ML IJ SOLN
INTRAMUSCULAR | Status: DC | PRN
  Administered 2022-03-11: 50 ug via INTRAVENOUS
  Administered 2022-03-11: 100 ug via INTRAVENOUS

## 2022-03-11 MED ORDER — HYDROCODONE-ACETAMINOPHEN 5-325 MG PO TABS
1.0000 | ORAL_TABLET | ORAL | Status: DC | PRN
  Administered 2022-03-12 (×2): 2 via ORAL
  Filled 2022-03-11 (×2): qty 2

## 2022-03-11 MED ORDER — ACETAMINOPHEN 10 MG/ML IV SOLN
INTRAVENOUS | Status: AC
  Filled 2022-03-11: qty 100

## 2022-03-11 MED ORDER — CEFAZOLIN SODIUM-DEXTROSE 2-4 GM/100ML-% IV SOLN
INTRAVENOUS | Status: AC
  Filled 2022-03-11: qty 100

## 2022-03-11 MED ORDER — PHENYLEPHRINE 80 MCG/ML (10ML) SYRINGE FOR IV PUSH (FOR BLOOD PRESSURE SUPPORT)
PREFILLED_SYRINGE | INTRAVENOUS | Status: AC
  Filled 2022-03-11: qty 10

## 2022-03-11 MED ORDER — MIDAZOLAM HCL 2 MG/2ML IJ SOLN
INTRAMUSCULAR | Status: DC | PRN
  Administered 2022-03-11: 2 mg via INTRAVENOUS

## 2022-03-11 MED ORDER — ROCURONIUM BROMIDE 10 MG/ML (PF) SYRINGE
PREFILLED_SYRINGE | INTRAVENOUS | Status: AC
  Filled 2022-03-11: qty 10

## 2022-03-11 MED ORDER — LOSARTAN POTASSIUM 50 MG PO TABS
100.0000 mg | ORAL_TABLET | Freq: Every morning | ORAL | Status: DC
  Administered 2022-03-12 – 2022-03-13 (×2): 100 mg via ORAL
  Filled 2022-03-11 (×2): qty 2

## 2022-03-11 MED ORDER — METOCLOPRAMIDE HCL 5 MG PO TABS: 5.0000 mg | ORAL_TABLET | Freq: Three times a day (TID) | ORAL | Status: DC | PRN

## 2022-03-11 MED ORDER — ONDANSETRON HCL 4 MG/2ML IJ SOLN: 4.0000 mg | Freq: Four times a day (QID) | INTRAMUSCULAR | Status: DC | PRN

## 2022-03-11 MED ORDER — ASPIRIN 81 MG PO TBEC
81.0000 mg | DELAYED_RELEASE_TABLET | Freq: Every morning | ORAL | Status: DC
  Administered 2022-03-12 – 2022-03-13 (×2): 81 mg via ORAL
  Filled 2022-03-11 (×2): qty 1

## 2022-03-11 MED ORDER — ACETAMINOPHEN 10 MG/ML IV SOLN: 1000.0000 mg | Freq: Once | INTRAVENOUS | Status: DC | PRN

## 2022-03-11 MED ORDER — OXYCODONE HCL 5 MG PO TABS
5.0000 mg | ORAL_TABLET | ORAL | Status: DC | PRN
  Administered 2022-03-11: 5 mg via ORAL

## 2022-03-11 SURGICAL SUPPLY — 79 items
ALCOHOL 70% 16 OZ (MISCELLANEOUS) ×2 IMPLANT
BAG COUNTER SPONGE SURGICOUNT (BAG) ×2 IMPLANT
BAG SPNG CNTER NS LX DISP (BAG) ×1
BANDAGE ESMARK 6X9 LF (GAUZE/BANDAGES/DRESSINGS) IMPLANT
BIT DRILL 2.7 QC CANN 155 (BIT) IMPLANT
BIT DRILL QC 2.5MM SHRT EVO SM (DRILL) IMPLANT
BLADE CLIPPER SURG (BLADE) IMPLANT
BNDG CMPR 9X6 STRL LF SNTH (GAUZE/BANDAGES/DRESSINGS)
BNDG COHESIVE 6X5 TAN STRL LF (GAUZE/BANDAGES/DRESSINGS) ×2 IMPLANT
BNDG ELASTIC 6X5.8 VLCR STR LF (GAUZE/BANDAGES/DRESSINGS) ×2 IMPLANT
BNDG ESMARK 6X9 LF (GAUZE/BANDAGES/DRESSINGS)
CLSR STERI-STRIP ANTIMIC 1/2X4 (GAUZE/BANDAGES/DRESSINGS) IMPLANT
COVER SURGICAL LIGHT HANDLE (MISCELLANEOUS) ×2 IMPLANT
CUFF TOURN SGL QUICK 34 (TOURNIQUET CUFF) ×1
CUFF TRNQT CYL 34X4.125X (TOURNIQUET CUFF) ×2 IMPLANT
DRAPE C-ARM 42X72 X-RAY (DRAPES) ×2 IMPLANT
DRAPE C-ARMOR (DRAPES) ×2 IMPLANT
DRAPE HALF SHEET 40X57 (DRAPES) ×2 IMPLANT
DRAPE IMP U-DRAPE 54X76 (DRAPES) ×4 IMPLANT
DRAPE INCISE IOBAN 66X45 STRL (DRAPES) ×2 IMPLANT
DRAPE ORTHO SPLIT 77X108 STRL (DRAPES) ×2
DRAPE SURG ORHT 6 SPLT 77X108 (DRAPES) ×4 IMPLANT
DRAPE U-SHAPE 47X51 STRL (DRAPES) ×2 IMPLANT
DRILL QC 2.5MM SHORT EVOS SM (DRILL) ×1
DRSG AQUACEL AG ADV 3.5X 4 (GAUZE/BANDAGES/DRESSINGS) IMPLANT
DRSG AQUACEL AG ADV 3.5X10 (GAUZE/BANDAGES/DRESSINGS) IMPLANT
DURAPREP 26ML APPLICATOR (WOUND CARE) ×2 IMPLANT
ELECT REM PT RETURN 9FT ADLT (ELECTROSURGICAL) ×1
ELECTRODE REM PT RTRN 9FT ADLT (ELECTROSURGICAL) ×2 IMPLANT
FACESHIELD STD STERILE (MASK) ×2 IMPLANT
GAUZE PAD ABD 8X10 STRL (GAUZE/BANDAGES/DRESSINGS) ×2 IMPLANT
GAUZE SPONGE 4X4 12PLY STRL LF (GAUZE/BANDAGES/DRESSINGS) ×2 IMPLANT
GAUZE XEROFORM 1X8 LF (GAUZE/BANDAGES/DRESSINGS) ×2 IMPLANT
GLOVE BIO SURGEON STRL SZ7.5 (GLOVE) ×4 IMPLANT
GLOVE BIOGEL PI IND STRL 8 (GLOVE) ×4 IMPLANT
GOWN STRL REUS W/ TWL LRG LVL3 (GOWN DISPOSABLE) ×4 IMPLANT
GOWN STRL REUS W/ TWL XL LVL3 (GOWN DISPOSABLE) ×4 IMPLANT
GOWN STRL REUS W/TWL LRG LVL3 (GOWN DISPOSABLE) ×2
GOWN STRL REUS W/TWL XL LVL3 (GOWN DISPOSABLE) ×2
GUIDE PIN 1.3 (PIN) ×1
HANDLE ARTHRO NANOSCOPE 2.7 (ORTHOPEDIC DISPOSABLE SUPPLIES) IMPLANT
K-WIRE 1.6 (WIRE) ×3
K-WIRE FX150X1.6XTROC PNT (WIRE) ×3
KIT ARTHRO SURG NANOEC TR (ORTHOPEDIC DISPOSABLE SUPPLIES) IMPLANT
KIT BASIN OR (CUSTOM PROCEDURE TRAY) ×2 IMPLANT
KIT TURNOVER KIT B (KITS) ×2 IMPLANT
KWIRE FX150X1.6XTROC PNT (WIRE) IMPLANT
MANIFOLD NEPTUNE II (INSTRUMENTS) ×2 IMPLANT
NDL SUT 6 .5 CRC .975X.05 MAYO (NEEDLE) IMPLANT
NEEDLE MAYO TAPER (NEEDLE)
NS IRRIG 1000ML POUR BTL (IV SOLUTION) ×2 IMPLANT
PACK GENERAL/GYN (CUSTOM PROCEDURE TRAY) ×2 IMPLANT
PAD ARMBOARD 7.5X6 YLW CONV (MISCELLANEOUS) ×4 IMPLANT
PIN GUIDE 1.3 (PIN) IMPLANT
PLATE TIB EVOS 3.5X111 8H LT (Plate) IMPLANT
PROBE ARTHRO NANOSCOPE NS (ORTHOPEDIC DISPOSABLE SUPPLIES) IMPLANT
PUTTY BONE HEMOSTAT MONTAGE 2C (Putty) IMPLANT
SCREW CANN 4.0X70 (Screw) IMPLANT
SCREW CORT 3.5X32 ST EVOS (Screw) IMPLANT
SCREW LOCK EVOS ST 3.5X34 (Screw) IMPLANT
SCREW LOCK EVOS ST 3.5X85 (Screw) IMPLANT
SCREW LOCK ST EVOS 3.5 X 80 (Screw) IMPLANT
SCREW LOCK ST EVOS 3.5X38 (Screw) IMPLANT
SCREW LOCKING 3.5X75 (Screw) IMPLANT
SHEATH SCOPE HI FLOW (MISCELLANEOUS) IMPLANT
STAPLER VISISTAT 35W (STAPLE) ×2 IMPLANT
STOCKINETTE IMPERVIOUS 9X36 MD (GAUZE/BANDAGES/DRESSINGS) ×2 IMPLANT
SUT ETHILON 2 0 FS 18 (SUTURE) IMPLANT
SUT ETHILON 3 0 PS 1 (SUTURE) IMPLANT
SUT FIBERWIRE #2 38 REV NDL BL (SUTURE) ×1
SUT MNCRL AB 3-0 PS2 18 (SUTURE) IMPLANT
SUT PDS AB 0 CT 36 (SUTURE) IMPLANT
SUT VIC AB 0 CT1 27 (SUTURE) ×2
SUT VIC AB 0 CT1 27XBRD ANBCTR (SUTURE) IMPLANT
SUT VIC AB 2-0 CT1 27 (SUTURE) ×2
SUT VIC AB 2-0 CT1 TAPERPNT 27 (SUTURE) IMPLANT
SUTURE FIBERWR#2 38 REV NDL BL (SUTURE) IMPLANT
TOWEL GREEN STERILE (TOWEL DISPOSABLE) ×4 IMPLANT
TOWEL GREEN STERILE FF (TOWEL DISPOSABLE) ×2 IMPLANT

## 2022-03-11 NOTE — Transfer of Care (Signed)
Immediate Anesthesia Transfer of Care Note  Patient: Albert Nunez  Procedure(s) Performed: OPEN REDUCTION INTERNAL FIXATION (ORIF) TIBIAL PLATEAU (Left) HARDWARE REMOVAL LEFT LEG (Left)  Patient Location: PACU  Anesthesia Type:General  Level of Consciousness: awake, drowsy, and patient cooperative  Airway & Oxygen Therapy: Patient Spontanous Breathing and Patient connected to nasal cannula oxygen  Post-op Assessment: Report given to RN and Post -op Vital signs reviewed and stable  Post vital signs: Reviewed and stable  Last Vitals:  Vitals Value Taken Time  BP    Temp    Pulse    Resp    SpO2      Last Pain:  Vitals:   03/11/22 1129  TempSrc:   PainSc: 0-No pain         Complications: No notable events documented.

## 2022-03-11 NOTE — Anesthesia Preprocedure Evaluation (Addendum)
Anesthesia Evaluation  Patient identified by MRN, date of birth, ID band Patient awake    Reviewed: Allergy & Precautions, NPO status , Patient's Chart, lab work & pertinent test results  Airway Mallampati: II  TM Distance: >3 FB Neck ROM: Full    Dental no notable dental hx.    Pulmonary neg pulmonary ROS   Pulmonary exam normal        Cardiovascular hypertension, Pt. on medications  Rhythm:Regular Rate:Normal     Neuro/Psych Bilateral leg weakness 2/2 prior fracture, has stimulator implant   Neuromuscular disease  negative psych ROS   GI/Hepatic negative GI ROS, Neg liver ROS,,,  Endo/Other  negative endocrine ROS    Renal/GU negative Renal ROS  negative genitourinary   Musculoskeletal Tibial plateau fracture    Abdominal Normal abdominal exam  (+)   Peds  Hematology negative hematology ROS (+)   Anesthesia Other Findings   Reproductive/Obstetrics                             Anesthesia Physical Anesthesia Plan  ASA: 3  Anesthesia Plan: General   Post-op Pain Management:    Induction: Intravenous  PONV Risk Score and Plan: 2 and Ondansetron, Dexamethasone, Midazolam and Treatment may vary due to age or medical condition  Airway Management Planned: Mask and Oral ETT  Additional Equipment: None  Intra-op Plan:   Post-operative Plan: Extubation in OR  Informed Consent: I have reviewed the patients History and Physical, chart, labs and discussed the procedure including the risks, benefits and alternatives for the proposed anesthesia with the patient or authorized representative who has indicated his/her understanding and acceptance.     Dental advisory given  Plan Discussed with: CRNA  Anesthesia Plan Comments: (Lab Results      Component                Value               Date                      WBC                      8.3                 03/11/2022                HGB                       12.2 (L)            03/11/2022                HCT                      35.1 (L)            03/11/2022                MCV                      87.5                03/11/2022                PLT  262                 03/11/2022             Lab Results      Component                Value               Date                      NA                       135                 03/11/2022                K                        4.0                 03/11/2022                CO2                      23                  03/11/2022                GLUCOSE                  117 (H)             03/11/2022                BUN                      13                  03/11/2022                CREATININE               0.94                03/11/2022                CALCIUM                  8.7 (L)             03/11/2022                GFRNONAA                 >60                 03/11/2022           )       Anesthesia Quick Evaluation

## 2022-03-11 NOTE — Discharge Instructions (Addendum)
Orthopedic discharge instructions:  Nonweightbearing to the left lower extremity at this time.  This will be in place for approximately 6 weeks.  2.  However, you may begin moving the knee soon as possible.  When you are not working with therapy I would recommend keeping your brace on and locked in full extension.  She also sleep in the brace for the first 2 weeks.  3.  Your bandages are waterproof along the skin.  You may remove the Ace wrap's on the third day after surgery and begin showering at that time.  Please do not submerge underwater.  4.  Apply ice to the left knee for 20 to 30 minutes out of each hour that you are awake.  She can do this around-the-clock when you are awake.  5.  For mild to moderate pain use Tylenol and Advil in alternating fashion.  You can do these around-the-clock.  For any breakthrough pain use oxycodone as necessary.  6.  While taking the narcotic medication you should also utilize Colace and/or MiraLAX every day to help prevent constipation from opioid usage.  7.  He will return to see Dr. Stann Mainland in the office in 2 weeks for routine postoperative care.  8.  We will resume your preoperative aspirin at 81 mg a day postoperatively starting on day #1.

## 2022-03-11 NOTE — Anesthesia Postprocedure Evaluation (Signed)
Anesthesia Post Note  Patient: Albert Nunez  Procedure(s) Performed: OPEN REDUCTION INTERNAL FIXATION (ORIF) TIBIAL PLATEAU (Left) HARDWARE REMOVAL LEFT LEG (Left)     Patient location during evaluation: PACU Anesthesia Type: General Level of consciousness: awake and alert Pain management: pain level controlled Vital Signs Assessment: post-procedure vital signs reviewed and stable Respiratory status: spontaneous breathing, nonlabored ventilation, respiratory function stable and patient connected to nasal cannula oxygen Cardiovascular status: blood pressure returned to baseline and stable Postop Assessment: no apparent nausea or vomiting Anesthetic complications: no  No notable events documented.  Last Vitals:  Vitals:   03/11/22 1730 03/11/22 1800  BP: 132/77 (!) 141/76  Pulse: 79 80  Resp: (!) 22 14  Temp:  37.1 C  SpO2: 94% 98%    Last Pain:  Vitals:   03/11/22 1745  TempSrc:   PainSc: 3                  Lopez Dentinger,W. EDMOND

## 2022-03-11 NOTE — Progress Notes (Signed)
Patient arrived to [place] room 11 alert and oriented x4. Pain level 4/10. Leg brace on. Bed in lowest position. Call light in reach. Will continue to monitor pt. Dinner tray ordered.

## 2022-03-11 NOTE — Progress Notes (Signed)
Orthopedic Tech Progress Note Patient Details:  Albert Nunez 11/01/1960 [id number]  MD called requesting to have a BLEDSOE brace to be dropped off to [facility], so I called in order to [facility]   Patient ID: Albert Nunez, male   DOB: 1960-10-22, 62 y.o.   MRN: [id number]  Albert Nunez 03/11/2022, 2:06 PM

## 2022-03-11 NOTE — Anesthesia Procedure Notes (Signed)
Procedure Name: Intubation Date/Time: 03/11/2022 2:21 PM  Performed by: Mosetta Pigeon, CRNAPre-anesthesia Checklist: Patient identified, Emergency Drugs available, Suction available and Patient being monitored Patient Re-evaluated:Patient Re-evaluated prior to induction Oxygen Delivery Method: Circle System Utilized Preoxygenation: Pre-oxygenation with 100% oxygen Induction Type: IV induction Ventilation: Mask ventilation without difficulty Laryngoscope Size: Mac and 4 Grade View: Grade II Tube type: Oral Tube size: 7.5 mm Number of attempts: 1 Airway Equipment and Method: Stylet Placement Confirmation: ETT inserted through vocal cords under direct vision, positive ETCO2 and breath sounds checked- equal and bilateral Secured at: 23 cm Tube secured with: Tape Dental Injury: Teeth and Oropharynx as per pre-operative assessment

## 2022-03-11 NOTE — H&P (Signed)
ORTHOPAEDIC H and P  REQUESTING PHYSICIAN: [name], MD  PCP:  Antony Contras, MD  Chief Complaint: Left tibial plateau fracture  HPI: Albert Nunez is a 62 y.o. male who complains of left knee pain and swelling following a fall.  Does have a remote history of IM nail to the left tibia years ago.  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  Unfortunately with his neuropathy he has some issues with falling.  This resulted in this complex medial plateau fracture.  Is here today for open reduction internal fixation.  Past Medical History:  Diagnosis Date   Balance problems    uses straight cane   Difficulty sleeping    Foot drop    DUE TO SPINAL SURG NERVE DAMAGE   Hyperlipidemia    no meds, diet controlled   Hypertension    Neuromuscular disorder (HCC)    NERVE DAMAGE /  S2 FRACTURE bilateral legs, left leg is worse than right, pt wears a leg brace on left leg   Numbness in feet    bilateral   Stress incontinence    Past Surgical History:  Procedure Laterality Date   COLONOSCOPY     SPINE SURGERY     TIBIA FRACTURE SURGERY     URINARY SPHINCTER IMPLANT  08/20/2011   Procedure: ARTIFICIAL URINARY SPHINCTER;  Surgeon: Reece Packer, MD;  Location: [facility];  Service: Urology;  Laterality: N/A;   VENA CAVA FILTER PLACEMENT     Social History   Socioeconomic History   Marital status: Married    Spouse name: Not on file   Number of children: Not on file   Years of education: Not on file   Highest education level: Not on file  Occupational History   Not on file  Tobacco Use   Smoking status: Never   Smokeless tobacco: Never  Vaping Use   Vaping Use: Never used  Substance and Sexual Activity   Alcohol use: No   Drug use: No   Sexual activity: Not Currently  Other Topics Concern   Not on file  Social History Narrative   Not on file   Social Determinants of Health   Financial Resource Strain: Not on file   Food Insecurity: Not on file  Transportation Needs: Not on file  Physical Activity: Not on file  Stress: Not on file  Social Connections: Not on file   Family History  Problem Relation Age of Onset   Cancer Mother    Hypertension Mother    Heart disease Father    Cancer Father    Hypertension Father    No Known Allergies Prior to Admission medications   Medication Sig Start Date End Date Taking? Authorizing Provider  amLODipine (NORVASC) 10 MG tablet Take 10 mg by mouth in the morning.   Yes [provider]  losartan (COZAAR) 100 MG tablet Take 100 mg by mouth in the morning.   Yes [provider]  oxyCODONE (OXY IR/ROXICODONE) 5 MG immediate release tablet Take 5 mg by mouth every 4 (four) hours as needed (pain.). 03/07/22  Yes [provider]  aspirin EC 81 MG tablet Take 81 mg by mouth in the morning.    [provider]   No results found.  Positive ROS: All other systems have been reviewed and were otherwise negative with the exception of those mentioned in the HPI and as above.  Physical Exam: General: Alert, no acute  distress Cardiovascular: No pedal edema Respiratory: No cyanosis, no use of accessory musculature GI: No organomegaly, abdomen is soft and non-tender Skin: No lesions in the area of chief complaint Neurologic: Sensation intact distally Psychiatric: Patient is competent for consent with normal mood and affect Lymphatic: No axillary or cervical lymphadenopathy  MUSCULOSKELETAL: Left lower extremity is warm and well-perfused.  There is mild edema throughout 1+.  He has moderate effusion of the knee.  Distally neurovascularly intact to about the mid tibia.  He has decreased sensation throughout the dorsal and plantar aspect of the left foot and a foot drop noted as well  Assessment: Left knee comminuted medial plateau fracture, closed  Plan: Plan to proceed with open reduction internal fixation as well as diagnostic  arthroscopy to aid with the articular reduction.  We did also discuss likely will need to perform selective hardware removal of the lateral to medial screws placed in the proximal tibial nail.  The risks, benefits, and alternatives were discussed with the patient. There are risks associated with the surgery including, but not limited to, problems with anesthesia (death), infection, differences in leg length/angulation/rotation, fracture of bones, loosening or failure of implants, malunion, nonunion, hematoma (blood accumulation) which may require surgical drainage, blood clots, pulmonary embolism, nerve injury (foot drop), and blood vessel injury. The patient understands these risks and elects to proceed.   Plan for postoperative admission for inpatient physical therapy due to deconditioning and high risk of falling.  Will also monitor for pain control.  Likely discharge home tomorrow.    [name], MD Cell [phone]    03/11/2022 1:57 PM

## 2022-03-11 NOTE — Op Note (Signed)
Date of Surgery: 03/11/2022  INDICATIONS: Albert Nunez is a 62 y.o.-year-old male who sustained a left-sided comminuted medial plateau fracture with bicondylar involvement, but essentially nondisplaced on the lateral tibial plateau fracture; he was indicated for open reduction and internal fixation due to the displaced nature of the articular fracture and came to the operating room today for this procedure. The patient did consent to the procedure after discussion of the risks and benefits.  PREOPERATIVE DIAGNOSIS:  1.  Closed bicondylar tibial plateau, left 2.  Retained hardware from previous tibial IM nail  POSTOPERATIVE DIAGNOSIS: Same.  PROCEDURE:   1.  Left tibial plateau open reduction and internal fixation of medial tibial plateau  2.  Left proximal tibia hardware removal.  Implants through a separate incision  SURGEON: Geralynn Rile, M.D.  ASSIST: Jonelle Sidle, PA-C  Assistant attestation:  PA McClung present for the entire procedure.  ANESTHESIA:  general  IV FLUIDS AND URINE: See anesthesia.  ESTIMATED BLOOD LOSS:: 100 mL.  IMPLANTS: [employer] left proximal tibial medial locking plate with 3.5 mm locking and nonlocking screws.  2 cc montage graft.  DRAINS: None  Tourniquet: Left 300 mmHg for 710 minutes  COMPLICATIONS: None.  DESCRIPTION OF PROCEDURE: The patient was brought to the operating room and placed supine on the operating table.  The patient had been signed prior to the procedure and this was documented. The patient had the anesthesia placed by the anesthesiologist.  The prep verification and incision time-outs were performed to confirm that this was the correct patient, site, side and location. The patient had an SCD on the opposite lower extremity. The patient did receive antibiotics prior to the incision and was re-dosed during the procedure as needed at indicated intervals.  The patient had the lower extremity prepped and draped in the standard  surgical fashion.  A Esmarch was used to exsanguinate the extremity and tourniquet was inflated at 300 mmHg.  The bony landmarks were palpated and the incision was drawn with a marker.    We began the procedure by approaching the lateral proximal tibia.  He had a previously placed intramedullary rod for the tibia.  The proximal 2 interlocking screws were at the apex of the proximal tibia fracture medially.  These were approached via the direct lateral approach.  Dissection was carried down through skin, subcutaneous tissue, muscle fascia and we then bluntly dissected down onto the lateral proximal tibia.  A Cobb elevator was used to identify the screw heads.  We then used a large hexagonal screwdriver to remove these.  We then moved our attention to the anterior medial proximal tibia.  A direct lateral approach was undertaken to the proximal medial tibia.  Dissection was carried down through skin and subcutaneous tissue into the pes bursa.  We did perform a release of the hamstring tendons en bloc with all 3 tendons being reflected posterior medially.  We then were able to identify a comminuted fracture of the proximal medial tibia with a 2 part propagating down to about the mid metaphysis medially.  We utilized a longitudinal force as well as a extreme valgus force to help reduce.  Kirschner wires were placed in the anterior and diaphyseal and metaphyseal components to hold the reduction.  We then utilized a freer elevator to work through the anterior medial fracture fragments to elevate the articular surface.  We then placed Kirschner wires as rafting device to hold/elevated the articular surface.  There was significant comminution and some metaphyseal  bone loss was certainly encountered.  There was a large metaphyseal fragment that was tethered to the superficial MCL.  We then decided to release the distal superficial MCL with tagging sutures and we could repair this back to the plate at the conclusion of the  procedure.  After reduction of articular fragments with a dental pick, the large periarticular clamp was placed at the proximal tibia and a small incision was made on the medial side to place this in the medial metaphysis area under fluoroscopy. This was done to reduce the widened condyles together.  We then proceeded with application of our posterior medial plate.  We checked AP and lateral fluoroscopic close to establish the appropriate position.  We then placed Kirschner wires through the plate.   After the joint was elevated, montage allograft was used 2 cc into the metaphyseal void.  The posterior medial plate was then placed on the apex of the fracture and this was also clamped down with the peri-articular clamp.  All screws were then placed in the standard fashion, first drilling, then measuring with a depth gauge, and then placing the screws on power and tightening by hand. The cortical screw was first placed just distal to the apex to bring the plate to the bone and form an axilla for the fracture.  The most proximal locking screws were then all placed. The remaining distal cortical screws were then placed.  All screws were confirmed on both AP and lateral views including both the length and location. The wound was copiously irrigated with 3 liters of saline via cysto tubing.  We then noted that the anterior medial fragment that had a large articular piece was not captured in the plate.  We had provisionally placed a pin from anterior to posterior here we replaced that with a 4.0 mm cancellous partially-threaded screw.  This had a decent fixation but we did get this bicortical.  Lastly we had considered a separate plate to help with the coronal split just posterior to the nail.  However, after the application of the medial plate with the posteriorly oriented cortical screws it was noted that this had excellent fixation.  Then the deep fascia was closed with 0 Vicryl figure-of-eight interrupted suture.   We placed a gram of vancomycin powder into the soft tissue.  The subcutaneous layer was closed with 2-0 vicryl. The skin was then reapproximated with 3-0 subcuticular Monocryl. The wounds were cleaned and dried a final time. A sterile dressing was placed. The patient was then wrapped in an Ace and placed in the unlocked hinged knee brace. The patient was then transferred back to the bed and left the operating room in stable condition.  All sponge and instrument counts were correct.  POSTOPERATIVE PLAN: Albert Nunez will remain nonweightbearing on this leg for approximately 6 weeks; he will return for suture removal in 2 weeks.  The hinged knee brace will remain on at this time, and will be locked, once his quadriceps muscle activates and he can do a straight leg raise he can then unlock the brace. Albert Nunez will receive DVT prophylaxis based on other medications, activity level, and risk ratio of bleeding to thrombosis.  He will be admitted postoperatively for inpatient therapy given his preoperative baseline function is quite poor on the left side.  He will resume his preoperative aspirin for DVT prophylaxis

## 2022-03-11 NOTE — Brief Op Note (Signed)
03/11/2022  4:20 PM  PATIENT:  Albert Nunez  62 y.o. male  PRE-OPERATIVE DIAGNOSIS:  Left medial and lateral tibial plateau fracture  POST-OPERATIVE DIAGNOSIS:  Left medial and lateral tibial plateau fracture  PROCEDURE:  Procedure(s) with comments: OPEN REDUCTION INTERNAL FIXATION (ORIF) TIBIAL PLATEAU (Left) - 120 HARDWARE REMOVAL LEFT LEG (Left)  SURGEON:  Surgeon(s) and Role:    * [name], MD - Primary  PHYSICIAN ASSISTANT: Jonelle Sidle, PA-C  ANESTHESIA:   local and general  EBL:  100 cc  BLOOD ADMINISTERED:none  DRAINS: none   LOCAL MEDICATIONS USED:  NONE  SPECIMEN:  No Specimen  DISPOSITION OF SPECIMEN:  N/A  COUNTS:  YES  TOURNIQUET: 120 minutes at 300 mmHg  DICTATION: .Note written in [place]: Admit for overnight observation  PATIENT DISPOSITION:  PACU - hemodynamically stable.   Delay start of Pharmacological VTE agent (>24hrs) due to surgical blood loss or risk of bleeding: not applicable

## 2022-03-12 MED ORDER — OXYCODONE HCL 5 MG PO TABS: 5.0000 mg | ORAL_TABLET | ORAL | 0 refills | Status: AC | PRN

## 2022-03-12 NOTE — Evaluation (Signed)
Occupational Therapy Evaluation and Discharge Patient Details Name: Albert Nunez MRN: [id number] DOB: 04/17/1960 Today's Date: 03/12/2022   History of Present Illness Pt is 62 yo male who presents on 03/11/22 after a recent fall resulting in a L tibial plateau fx, now s/p ORIF. PMH: spinal stenosis with L foot drop and incontinence of bowel, spinal surgery, vena cava filter   Clinical Impression   This 63 yo male admitted and underwent above presents to acute OT with all education completed and pt has figured out pretty much how he needs to things since he was dealing with LLE at home prior to surgery. No further OT needs, we will sign off.      Recommendations for follow up therapy are one component of a multi-disciplinary discharge planning process, led by the attending physician.  Recommendations may be updated based on patient status, additional functional criteria and insurance authorization.   Follow Up Recommendations  No OT follow up     Assistance Recommended at Discharge PRN  Patient can return home with the following A little help with walking and/or transfers;A little help with bathing/dressing/bathroom;Assistance with cooking/housework;Assist for transportation;Help with stairs or ramp for entrance    Functional Status Assessment  Patient has had a recent decline in their functional status and demonstrates the ability to make significant improvements in function in a reasonable and predictable amount of time. (without further need for OT services)  Equipment Recommendations  None recommended by OT       Precautions / Restrictions Precautions Precautions: Fall Precaution Booklet Issued: No Precaution Comments: discussed proper positioning and wear of brace Required Braces or Orthoses: Other Brace Other Brace: Okay for all range of motion passive and active with brace unlocked or removed.(per Dr. Vassie Moment note today 1/23) Restrictions Weight Bearing Restrictions:  Yes LLE Weight Bearing: Non weight bearing Other Position/Activity Restrictions: Wears a L AFO at baseline      Mobility Bed Mobility Overal bed mobility: Needs Assistance Bed Mobility: Sit to Supine       Sit to supine: Min assist   General bed mobility comments: Min A for LLE only for back into bed from sitting EOB--cannot use a leg lifter or gait belt to A LLE back into bed due to he has to use Bil UEs to scoot self into bed    Transfers Overall transfer level: Needs assistance Equipment used: Rolling walker (2 wheels) Transfers: Sit to/from Stand Sit to Stand: Min guard                  Balance Overall balance assessment: Needs assistance Sitting-balance support: No upper extremity supported, Feet supported Sitting balance-Leahy Scale: Good     Standing balance support: Bilateral upper extremity supported, During functional activity Standing balance-Leahy Scale: Poor                             ADL either performed or assessed with clinical judgement   ADL Overall ADL's : Needs assistance/impaired Eating/Feeding: Independent;Sitting   Grooming: Set up;Sitting   Upper Body Bathing: Set up;Sitting   Lower Body Bathing: Minimal assistance;Sitting/lateral leans Lower Body Bathing Details (indicate cue type and reason): min guard A sit<>stand Upper Body Dressing : Set up;Sitting   Lower Body Dressing: Set up Lower Body Dressing Details (indicate cue type and reason): min guard A sit<>stand; pt reports he has been using a reacher at home to help him get dressed Toilet Transfer: Min guard;Ambulation;Comfort height  toilet;Grab bars;Rolling walker (2 wheels)   Toileting- Clothing Manipulation and Hygiene: Total assistance Toileting - Clothing Manipulation Details (indicate cue type and reason): for back peri care in standing; min guard A sit<>stand   Tub/Shower Transfer Details (indicate cue type and reason): pt reports he was told by doctor today  that he can get in the shower in 3 days witthout brace on for transfers   General ADL Comments: Pt reports he was told by doctor today that he could have bledsoe brace off when in bed     Vision Patient Visual Report: No change from baseline              Pertinent Vitals/Pain Pain Assessment Pain Assessment: Faces Faces Pain Scale: Hurts a little bit Pain Location: LLE with sit<>stand Pain Descriptors / Indicators: Grimacing Pain Intervention(s): Limited activity within patient's tolerance, Monitored during session, Repositioned     Hand Dominance Left   Extremity/Trunk Assessment Upper Extremity Assessment Upper Extremity Assessment: Overall WFL for tasks assessed      Communication Communication Communication: No difficulties   Cognition Arousal/Alertness: Awake/alert Behavior During Therapy: WFL for tasks assessed/performed, Impulsive Overall Cognitive Status: Within Functional Limits for tasks assessed                                                  Home Living Family/patient expects to be discharged to:: Private residence Living Arrangements: Spouse/significant other Available Help at Discharge: Family;Available 24 hours/day Type of Home: House Home Access: Stairs to enter [employer] of Steps: 2 then 3 Entrance Stairs-Rails: Right Home Layout: One level     Bathroom Shower/Tub: Occupational psychologist: Handicapped height Bathroom Accessibility: Yes   Home Equipment: Conservation officer, nature (2 wheels);Crutches;Cane - single point;Tub bench   Additional Comments: above info is for sister's home where he plans to d/c. Ex-wife's home is level entry but she will not be home and family does not feel comfortable with his kids being primary caregivers due to his bowel incontinence      Prior Functioning/Environment Prior Level of Function : Independent/Modified Independent;Driving             Mobility Comments: used a  cane due to balance deficits ADLs Comments: he reports he is normally independent but not since he fell. Is usually on a bowel schedule at home which help prevents accidents        OT Problem List: Decreased range of motion;Decreased activity tolerance         OT Goals(Current goals can be found in the care plan section) Acute Rehab OT Goals Patient Stated Goal: to go home tomorrow         AM-PAC OT "6 Clicks" Daily Activity     Outcome Measure Help from another person eating meals?: None Help from another person taking care of personal grooming?: A Little Help from another person toileting, which includes using toliet, bedpan, or urinal?: A Little Help from another person bathing (including washing, rinsing, drying)?: A Little Help from another person to put on and taking off regular upper body clothing?: A Little Help from another person to put on and taking off regular lower body clothing?: A Little 6 Click Score: 19   End of Session Equipment Utilized During Treatment: Gait belt;Rolling walker (2 wheels)  Activity Tolerance: Patient tolerated treatment well Patient left: in  bed;with call bell/phone within reach;with bed alarm set  OT Visit Diagnosis: Unsteadiness on feet (R26.81);Other abnormalities of gait and mobility (R26.89);Muscle weakness (generalized) (M62.81)                Time: 2202-5427 OT Time Calculation (min): 19 min Charges:  OT General Charges $OT Visit: 1 Visit OT Evaluation $OT Eval Moderate Complexity: [place], OTR/L Acute [employer] [facility] [phone] Office [phone]    Almon Register 03/12/2022, 4:26 PM

## 2022-03-12 NOTE — Progress Notes (Signed)
   Subjective:  Albert Nunez is a 62 y.o. male, 1 Day Post-Op    s/p Procedure(s): OPEN REDUCTION INTERNAL FIXATION (ORIF) TIBIAL PLATEAU HARDWARE REMOVAL LEFT LEG   Patient reports pain as mild to moderate.  Reports pain is okay, well-managed by pain medication every 4-5 hours.  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  Denies fever or chills, passing gas, able to urinate normally.  No bowel movements.  Objective:   VITALS:   Vitals:   03/11/22 1955 03/12/22 0026 03/12/22 0654 03/12/22 0827  BP: (!) 140/81 (!) 143/84 126/78 122/88  Pulse: 99 92 83 78  Resp:    17  Temp: 99.4 F (37.4 C) 98.6 F (37 C) 98.8 F (37.1 C) 98.6 F (37 C)  TempSrc: Oral  Oral Oral  SpO2: 95% 95% 92% 93%  Weight:      Height:       Sitting in hospital chair, no acute distress  Left lower extremity: Sensation intact distally Intact pulses distally Dorsiflexion/Plantar flexion intact Incision: dressing C/D/I Limited foot range of motion secondary to baseline foot drop, resting at approximately 0 degrees of extension, able to flex to 30 degrees with mild discomfort.  Ace wrap with Aquacel dressing beneath intact.  T ROM brace on.  Calves soft and supple bilaterally.  Lab Results  Component Value Date   WBC 8.3 03/11/2022   HGB 12.2 (L) 03/11/2022   HCT 35.1 (L) 03/11/2022   MCV 87.5 03/11/2022   PLT 262 03/11/2022   BMET    Component Value Date/Time   NA 135 03/11/2022 1138   K 4.0 03/11/2022 1138   CL 103 03/11/2022 1138   CO2 23 03/11/2022 1138   GLUCOSE 117 (H) 03/11/2022 1138   BUN 13 03/11/2022 1138   CREATININE 0.94 03/11/2022 1138   CALCIUM 8.7 (L) 03/11/2022 1138   GFRNONAA >60 03/11/2022 1138     Assessment/Plan: 1 Day Post-Op   Principal Problem:   Closed bicondylar fracture of left tibial plateau   Advance diet Up with therapy  Dispo: Plan for repeat PT session tomorrow, should  hopefully be good for discharge following this.  Weightbearing Status: Nonweightbearing to the left lower extremity, okay for toe-touch limited by his foot drop.  Ambulate with walker.  Okay for all range of motion passive and active with brace unlocked or removed. DVT Prophylaxis: Aspirin  Will provisionally place a discharge order assuming patient passes PT tomorrow.  Order also placed for HHPT   Faythe Casa 03/12/2022, 1:18 PM  Jonelle Sidle PA-C  Physician Assistant with Dr. Rogers, [place]

## 2022-03-12 NOTE — Evaluation (Signed)
Physical Therapy Evaluation Patient Details Name: Albert Nunez MRN: [id number] DOB: [birth year] Today's Date: 03/12/2022  History of Present Illness  Pt is 62 yo male who presents on 03/11/22 after a fall resulting in a L tibial plateau fx s/p ORIF. PMH: spinal stenosis with L foot drop and incontinence of bowel, spinal surgery, vena cava filter  Clinical Impression  Pt admitted with above diagnosis. Pt independent with cane and L AFO PTA despite balance deficits from spinal injury. Plan is for pt to go to his sister's home where he has 24 hr supervision but will have stairs to enter. Will need to practice this before d/c. Pt able to come to EOB and stand with min A and RW. Took hops fwd and bkwd. Pt unable to lift LLE against gravity at this point but working towards this. Given a strap to stretch L ankle as he has foot drop on that side. Recommend w/c rental with L elevating leg rests. Pt has other needed equipment. Would benefit from OT consult. Recommend HHPT at d/c.   Pt currently with functional limitations due to the deficits listed below (see PT Problem List). Pt will benefit from skilled PT to increase their independence and safety with mobility to allow discharge to the venue listed below.          Recommendations for follow up therapy are one component of a multi-disciplinary discharge planning process, led by the attending physician.  Recommendations may be updated based on patient status, additional functional criteria and insurance authorization.  Follow Up Recommendations Home health PT      Assistance Recommended at Discharge Intermittent Supervision/Assistance  Patient can return home with the following  A little help with walking and/or transfers;A little help with bathing/dressing/bathroom;Help with stairs or ramp for entrance;Assist for transportation;Assistance with Forensic psychologist (measurements PT) (with L elevating leg rest)   Recommendations for Other Services  OT consult    Functional Status Assessment Patient has had a recent decline in their functional status and demonstrates the ability to make significant improvements in function in a reasonable and predictable amount of time.     Precautions / Restrictions Precautions Precautions: Fall;Knee Precaution Booklet Issued: No Precaution Comments: discussed proper positioning and wear of brace Required Braces or Orthoses: Other Brace Other Brace: Bledsoe brace L knee, locked until able to perform SLR and can then be unlocked Restrictions Weight Bearing Restrictions: Yes LLE Weight Bearing: Non weight bearing Other Position/Activity Restrictions: Wears a L AFO at baseline      Mobility  Bed Mobility Overal bed mobility: Needs Assistance Bed Mobility: Supine to Sit     Supine to sit: Min assist     General bed mobility comments: min A to LLE, pt able to manage trunk    Transfers Overall transfer level: Needs assistance Equipment used: Rolling walker (2 wheels) Transfers: Sit to/from Stand, Bed to chair/wheelchair/BSC Sit to Stand: Min assist Stand pivot transfers: Min assist         General transfer comment: min A to steady. Pt able to keep LLE NWB. R shoe donned forst with assist to raise him on R side.    Ambulation/Gait Ambulation/Gait assistance: Min assist Gait Distance (Feet): 8 Feet (4' fwd, 4' bkwd) Assistive device: Rolling walker (2 wheels) Gait Pattern/deviations:  (hopping) Gait velocity: decreased Gait velocity interpretation: <1.31 ft/sec, indicative of household ambulator   General Gait Details: pt able to hop fwds and bkwds with RW maintaining NWB  Stairs  Wheelchair Mobility    Modified Rankin (Stroke Patients Only)       Balance Overall balance assessment: Needs assistance, History of Falls Sitting-balance support: Feet supported, Single extremity supported Sitting balance-Leahy Scale:  Good     Standing balance support: Bilateral upper extremity supported, During functional activity Standing balance-Leahy Scale: Poor Standing balance comment: reliant on UE support due to LLE NWB                             Pertinent Vitals/Pain Pain Assessment Pain Assessment: Faces Faces Pain Scale: Hurts even more Pain Location: L knee with mvmt Pain Descriptors / Indicators: Grimacing, Guarding Pain Intervention(s): Limited activity within patient's tolerance, Monitored during session, Repositioned, Premedicated before session    Home Living Family/patient expects to be discharged to:: Private residence Living Arrangements: Spouse/significant other Available Help at Discharge: Family;Available 24 hours/day Type of Home: House Home Access: Stairs to enter Entrance Stairs-Rails: Right Entrance Stairs-Number of Steps: 2 then 3   Home Layout: One level Home Equipment: Conservation officer, nature (2 wheels);Crutches;Shower seat;Cane - single point Additional Comments: above info is for sister's home where he plans to d/c. Ex-wife's home is level entry but she will not be home and family does not feel comfortable with his kids being primary caregivers due to his bowel incontinence    Prior Function Prior Level of Function : Independent/Modified Independent;Driving             Mobility Comments: used a cane due to balance deficits ADLs Comments: he reports he is normally independent but not since he fell. Is usually on a bowel schedule at home which help prevents accidents     Hand Dominance   Dominant Hand: Left    Extremity/Trunk Assessment   Upper Extremity Assessment Upper Extremity Assessment: Defer to OT evaluation    Lower Extremity Assessment Lower Extremity Assessment: RLE deficits/detail;LLE deficits/detail RLE Deficits / Details: pt has increased fluctuating tone RLE with R foot in pf spasm when PT entered. Strength >3/5 throughout RLE Sensation: decreased  proprioception;decreased light touch RLE Coordination: decreased gross motor LLE Deficits / Details: 0 active df, hip flex 1/5, knee NT due to Bledsoe brace LLE: Unable to fully assess due to immobilization;Unable to fully assess due to pain LLE Sensation: decreased proprioception;decreased light touch LLE Coordination: decreased gross motor    Cervical / Trunk Assessment Cervical / Trunk Assessment: Normal  Communication   Communication: No difficulties  Cognition Arousal/Alertness: Awake/alert Behavior During Therapy: WFL for tasks assessed/performed, Impulsive Overall Cognitive Status: Within Functional Limits for tasks assessed                                 General Comments: family describes him as stubborn and liking to do everything for himself. Mildly impulsive but can verbalize the need to use caution right now        General Comments General comments (skin integrity, edema, etc.): ex wife and daughter present on eval    Exercises General Exercises - Lower Extremity Ankle Circles/Pumps: PROM, Left, 10 reps, Other (comment) (with strap) Quad Sets: AROM, Left, 10 reps, Seated Straight Leg Raises: AAROM, Left, 10 reps, Supine   Assessment/Plan    PT Assessment Patient needs continued PT services  PT Problem List Decreased strength;Decreased balance;Decreased range of motion;Decreased mobility;Decreased coordination;Decreased knowledge of use of DME;Decreased safety awareness;Decreased knowledge of precautions;Pain;Impaired sensation;Impaired tone  PT Treatment Interventions DME instruction;Gait training;Stair training;Functional mobility training;Therapeutic activities;Therapeutic exercise;Balance training;Neuromuscular re-education;Patient/family education    PT Goals (Current goals can be found in the Care Plan section)  Acute Rehab PT Goals Patient Stated Goal: return home PT Goal Formulation: With patient Time For Goal Achievement:  03/26/22 Potential to Achieve Goals: Good    Frequency Min 4X/week     Co-evaluation               AM-PAC PT "6 Clicks" Mobility  Outcome Measure Help needed turning from your back to your side while in a flat bed without using bedrails?: None Help needed moving from lying on your back to sitting on the side of a flat bed without using bedrails?: A Little Help needed moving to and from a bed to a chair (including a wheelchair)?: A Little Help needed standing up from a chair using your arms (e.g., wheelchair or bedside chair)?: A Little Help needed to walk in hospital room?: A Little Help needed climbing 3-5 steps with a railing? : A Lot 6 Click Score: 18    End of Session Equipment Utilized During Treatment: Gait belt;Left knee immobilizer Activity Tolerance: Patient tolerated treatment well Patient left: in chair;with call bell/phone within reach;with chair alarm set;with family/visitor present Nurse Communication: Mobility status PT Visit Diagnosis: Unsteadiness on feet (R26.81);Difficulty in walking, not elsewhere classified (R26.2);Pain Pain - Right/Left: Left Pain - part of body: Knee    Time: 1136-1207 PT Time Calculation (min) (ACUTE ONLY): 31 min   Charges:   PT Evaluation $PT Eval Moderate Complexity: 1 Mod PT Treatments $Gait Training: 8-22 mins        Lyanne Co, PT  Acute Rehab Services Secure chat preferred Office [phone]   Lawana Chambers Elan Mcelvain 03/12/2022, 1:37 PM

## 2022-03-12 NOTE — TOC Initial Note (Addendum)
Transition of Care [facility]) - Initial/Assessment Note    Patient Details  Name: Albert Nunez MRN: [id number] Date of Birth: Sep 28, 1960  Transition of Care [facility]) CM/SW Contact:    Marilu Favre, RN Phone Number: 03/12/2022, 2:19 PM  Clinical Narrative:                  Spoke to patient at bedside. He plans to go stay with his sister at discharge. Pam her address is [address] [place] [phone] .   Confirmed his cell number. Explained home health is usually twice a week for about a hour at a time. Once home health agency does his admission assessment they will discuss frequency of visits etc .   Patient has a walker at home already.   Ordered wheel chair with Erasmo Downer with [employer]. Asked PA to co sign DME progress note and order .  Tommi Rumps with [facility] accepted and has sister's address  Expected Discharge Plan: [place] Barriers to Discharge: Continued Medical Work up   Patient Goals and CMS Choice Patient states their goals for this hospitalization and ongoing recovery are:: to return home CMS Medicare.gov Compare Post Acute Care list provided to:: Patient Choice offered to / list presented to : Patient      Expected Discharge Plan and Services   Discharge Planning Services: CM Consult Post Acute Care Choice: Home Health, Durable Medical Equipment Living arrangements for the past 2 months: Single Family Home Expected Discharge Date: 03/13/22               DME Arranged: Wheelchair manual DME Agency: [employer] Date DME Agency Contacted: 03/12/22 Time DME Agency Contacted: [phone] Representative spoke with at DME Agency: Erasmo Downer HH Arranged: PT [place]: [place] Date [place]: 03/12/22 Time [place]: 103 Representative spoke with at [place]: Tommi Rumps  Prior Living Arrangements/Services Living arrangements for the past 2 months: [place] Lives with:: Self Patient language and need for interpreter  reviewed:: Yes Do you feel safe going back to the place where you live?: Yes      Need for Family Participation in Patient Care: Yes (Comment) Care giver support system in place?: Yes (comment) Current home services: DME Criminal Activity/Legal Involvement Pertinent to Current Situation/Hospitalization: No - Comment as needed  Activities of Daily Living Home Assistive Devices/Equipment: Brace (specify type), Cane (specify quad or straight), Blood pressure cuff, Dentures (specify type), Raised toilet seat with rails, Shower chair without back ADL Screening (condition at time of admission) Patient's cognitive ability adequate to safely complete daily activities?: Yes Is the patient deaf or have difficulty hearing?: Yes Does the patient have difficulty seeing, even when wearing glasses/contacts?: No Does the patient have difficulty concentrating, remembering, or making decisions?: No Patient able to express need for assistance with ADLs?: Yes Does the patient have difficulty dressing or bathing?: No Independently performs ADLs?: Yes (appropriate for developmental age) Does the patient have difficulty walking or climbing stairs?: No Weakness of Legs: Left Weakness of Arms/Hands: None  Permission Sought/Granted   Permission granted to share information with : No              Emotional Assessment Appearance:: Appears stated age Attitude/Demeanor/Rapport: Engaged Affect (typically observed): Accepting Orientation: : Oriented to Self, Oriented to Place, Oriented to  Time, Oriented to Situation Alcohol / Substance Use: Not Applicable Psych Involvement: No (comment)  Admission diagnosis:  Closed bicondylar fracture of left tibial plateau [S82.142A] Patient Active  Problem List   Diagnosis Date Noted   Closed bicondylar fracture of left tibial plateau 03/11/2022   Cauda equina compression ([place]) 04/29/2011   Nerve pain 04/29/2011   Neurogenic bladder 04/29/2011   PCP:  Antony Contras,  MD Pharmacy:   [id number] - [place], [place] [address] AT [address] [place] [address] Phone: [phone] Fax: [phone]     Social Determinants of Health (SDOH) Social History: SDOH Screenings   Tobacco Use: Low Risk  (03/11/2022)   SDOH Interventions:     Readmission Risk Interventions     No data to display

## 2022-03-12 NOTE — TOC CM/SW Note (Signed)
Durable Medical Equipment  (From admission, onward)           Start     Ordered   03/12/22 1415  For home use only DME standard manual wheelchair with seat cushion  Once       Comments: Patient suffers from Closed bicondylar fracture of left tibial plateau which impairs their ability to perform daily activities like ambulating  in the home.  A cane  will not resolve issue with performing activities of daily living. A wheelchair will allow patient to safely perform daily activities. Patient can safely propel the wheelchair in the home or has a caregiver who can provide assistance. Length of need lifetime . Accessories: elevating leg rests (ELRs), wheel locks, extensions and anti-tippers.   Seat and back cushions   (with L elevating leg rest)   03/12/22 1416

## 2022-03-12 NOTE — Progress Notes (Signed)
Norco prn pain med given as requested

## 2022-03-13 NOTE — Progress Notes (Signed)
Physical Therapy Treatment Patient Details Name: Albert Nunez MRN: [id number] DOB: 1960-05-07 Today's Date: 03/13/2022   History of Present Illness Pt is 62 yo male who presents on 03/11/22 after a fall resulting in a L tibial plateau fx s/p ORIF. PMH: spinal stenosis with L foot drop and incontinence of bowel, spinal surgery, vena cava filter    PT Comments    Pt received in supine, eager to participate in therapy session for gait/transfer and stair training. Pt instructed on wheelchair safety and parts mgmt with good carryover of safety cues given. Pt compliant with LLE NWB throughout and needing mostly min guard for safety with transfers and gait. Pt modI for wheelchair mobility in hallway and performs stairs with up to minA after initial visual/verbal demo. Family present for caregiver instruction. Pt continues to benefit from PT services to progress toward functional mobility goals.    Recommendations for follow up therapy are one component of a multi-disciplinary discharge planning process, led by the attending physician.  Recommendations may be updated based on patient status, additional functional criteria and insurance authorization.  Follow Up Recommendations  Home health PT     Assistance Recommended at Discharge Intermittent Supervision/Assistance  Patient can return home with the following A little help with walking and/or transfers;A little help with bathing/dressing/bathroom;Help with stairs or ramp for entrance;Assist for transportation;Assistance with Charity fundraiser (measurements PT) (with L elevating leg rest)    Recommendations for Other Services OT consult     Precautions / Restrictions Precautions Precautions: Fall;Knee Precaution Booklet Issued: Yes (comment) (TKA handout with WB precs in room already) Precaution Comments: discussed proper positioning and wear of brace Required Braces or Orthoses: Other Brace Other  Brace: Bledsoe brace, locked for mobility, can unlock knee portion w/mobility once able to perform SLR unsupported. Okay for all range of motion passive and active with brace unlocked or removed. Restrictions Weight Bearing Restrictions: Yes LLE Weight Bearing: Non weight bearing Other Position/Activity Restrictions: Wears a L AFO at baseline     Mobility  Bed Mobility Overal bed mobility: Needs Assistance Bed Mobility: Supine to Sit     Supine to sit: Min assist     General bed mobility comments: min A to LLE, pt able to manage trunk    Transfers Overall transfer level: Needs assistance Equipment used: Rolling walker (2 wheels) Transfers: Sit to/from Stand Sit to Stand: Min guard           General transfer comment: min guard for safety. Pt able to keep LLE NWB. R shoe donned prior to standing to ensure R side slightly higher given L NWB.    Ambulation/Gait Ambulation/Gait assistance: Min guard Gait Distance (Feet): 30 Feet (53ft, seated break, 9ft) Assistive device: Rolling walker (2 wheels) Gait Pattern/deviations:  (hop-to) Gait velocity: decreased     General Gait Details: good compliance with NWB precs, good RW management, no LOB, min guard for safety.   Stairs Stairs: Yes Stairs assistance: Min guard, Min assist Stair Management: One rail Left, With crutches, Backwards, Forwards, Step to pattern Number of Stairs: 3 (x2 trials (6 total)) General stair comments: initial cues/demo for safe technique options, pt ultimately decided to trial with single rail (on his L side with backward ascent) and crutch on opposite side. Pt family present and able to demo safe guarding with gait belt on second trial, pt with good carryover of instruction and safety awareness. Compliant with NWB on LLE throughout   Corporate treasurer  Wheelchair mobility: Yes Wheelchair propulsion: Both upper extremities Wheelchair parts: Supervision/cueing Distance:  75 Wheelchair Assistance Details (indicate cue type and reason): initial cues for WC parts, pt able to assist with leg rest placement and w/c locking/unlocking. Foot rest on RLE adjusted for him and geomat pressure relief cushion ordered to his room as pt with SCI and at higher risk of pressure sores while in chair. Pt able to demo back w/c safety and set-up but may need assist for leg rests and for safety given hx SCI and current LLE NWB and pain  Modified Rankin (Stroke Patients Only)       Balance Overall balance assessment: Needs assistance, History of Falls Sitting-balance support: Feet supported, Single extremity supported Sitting balance-Leahy Scale: Good     Standing balance support: Bilateral upper extremity supported, During functional activity, Reliant on assistive device for balance Standing balance-Leahy Scale: Fair Standing balance comment: reliant on UE support due to LLE NWB, pt fair balance w/BUE support, poor with single UE support                            Cognition Arousal/Alertness: Awake/alert Behavior During Therapy: WFL for tasks assessed/performed, Impulsive Overall Cognitive Status: Within Functional Limits for tasks assessed                                 General Comments: Pt able to make his needs known well, following safety cues well, good carryvoer of instructions given within the session.        Exercises General Exercises - Lower Extremity Ankle Circles/Pumps: PROM, Left, 10 reps, Other (comment) (with strap) Quad Sets: AROM, Left, 10 reps, Seated Long Arc Quad: AAROM, Left, Other (comment) (~3 reps, limited due to pain, brace positioning) Straight Leg Raises: AAROM, Left, Supine, 5 reps    General Comments General comments (skin integrity, edema, etc.): no acute s/sx distress or dizziness reported with transfers, VSS per chart review.      Pertinent Vitals/Pain Pain Assessment Pain Assessment: 0-10 Pain Score: 4   Pain Location: L knee with mvmt Pain Descriptors / Indicators: Grimacing, Guarding Pain Intervention(s): Monitored during session, Repositioned    Home Living                          Prior Function            PT Goals (current goals can now be found in the care plan section) Acute Rehab PT Goals Patient Stated Goal: return home PT Goal Formulation: With patient Time For Goal Achievement: 03/26/22 Progress towards PT goals: Progressing toward goals    Frequency    Min 4X/week      PT Plan      Co-evaluation              AM-PAC PT "6 Clicks" Mobility   Outcome Measure  Help needed turning from your back to your side while in a flat bed without using bedrails?: None Help needed moving from lying on your back to sitting on the side of a flat bed without using bedrails?: A Little Help needed moving to and from a bed to a chair (including a wheelchair)?: A Little Help needed standing up from a chair using your arms (e.g., wheelchair or bedside chair)?: A Little Help needed to walk in hospital room?: A Little Help needed climbing 3-5 steps  with a railing? : A Lot 6 Click Score: 18    End of Session Equipment Utilized During Treatment: Gait belt;Left knee immobilizer Activity Tolerance: Patient tolerated treatment well Patient left: in chair;with call bell/phone within reach;with chair alarm set;with family/visitor present Nurse Communication: Mobility status PT Visit Diagnosis: Unsteadiness on feet (R26.81);Difficulty in walking, not elsewhere classified (R26.2);Pain Pain - Right/Left: Left Pain - part of body: Knee     Time: 1110-1153 PT Time Calculation (min) (ACUTE ONLY): 43 min  Charges:  $Gait Training: 23-37 mins $Therapeutic Activity: 8-22 mins                     Anas Reister P., PTA Acute Rehabilitation Services Secure Chat Preferred 9a-5:30pm Office: [place] 03/13/2022, 1:55 PM

## 2022-03-13 NOTE — Discharge Summary (Signed)
Patient ID: Albert Nunez MRN: [id number] DOB/AGE: 1960-10-31 62 y.o.  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   Weight as of this encounter: 102.1 kg.  Procedure:  Procedure(s) (LRB): OPEN REDUCTION INTERNAL FIXATION (ORIF) TIBIAL PLATEAU (Left) HARDWARE REMOVAL LEFT LEG (Left)   Consults: None  HPI: Albert Nunez Is a 62 year old male who was seen in our office for left knee pain after a fall.  It history of a tibial IM nail many years ago.  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  He suffered a complex medial tibial plateau fracture is indicated for operative fixation.  He underwent surgery on 03/11/2022 and was admitted for postoperative pain, monitoring, therapy.  Laboratory Data: Admission on 03/11/2022  Component Date Value Ref Range Status   Sodium 03/11/2022 135  135 - 145 mmol/L Final   Potassium 03/11/2022 4.0  3.5 - 5.1 mmol/L Final   Chloride 03/11/2022 103  98 - 111 mmol/L Final   CO2 03/11/2022 23  22 - 32 mmol/L Final   Glucose, Bld 03/11/2022 117 (H)  70 - 99 mg/dL Final   Glucose reference range applies only to samples taken after fasting for at least 8 hours.   BUN  03/11/2022 13  8 - 23 mg/dL Final   Creatinine, Ser 03/11/2022 0.94  0.61 - 1.24 mg/dL Final   Calcium 03/11/2022 8.7 (L)  8.9 - 10.3 mg/dL Final   GFR, Estimated 03/11/2022 >60  >60 mL/min Final   Comment: (NOTE) Calculated using the CKD-EPI Creatinine Equation (2021)    Anion gap 03/11/2022 9  5 - 15 Final   Performed at [facility], [place] [address], [place], [place] [address]   WBC 03/11/2022 8.3  4.0 - 10.5 K/uL Final   RBC 03/11/2022 4.01 (L)  4.22 - 5.81 MIL/uL Final   Hemoglobin 03/11/2022 12.2 (L)  13.0 - 17.0 g/dL Final   HCT 03/11/2022 35.1 (L)  39.0 - 52.0 % Final   MCV 03/11/2022 87.5  80.0 - 100.0 fL Final   MCH 03/11/2022 30.4  26.0 - 34.0 pg Final   MCHC 03/11/2022 34.8  30.0 - 36.0 g/dL Final   RDW 03/11/2022 12.0  11.5 - 15.5 % Final   Platelets 03/11/2022 262  150 - 400 K/uL Final   nRBC 03/11/2022 0.0  0.0 - 0.2 % Final   Performed at [facility] [address], [place], [place] [address]     X-Rays:DG Knee Complete 4 Views Left  Result Date: 03/11/2022 CLINICAL DATA:  Elective surgery, ORIF EXAM: LEFT KNEE - COMPLETE  4+ VIEW COMPARISON:  CT 03/06/2022 FINDINGS: Intraoperative images during medial plate fixation of the tibial plateau fracture. Improved fracture alignment. Intact hardware without evidence of immediate complication. Three proximal interlocking screws of the tibial nail have also been removed. IMPRESSION: Intraoperative images during plate fixation of the proximal tibia. Improved fracture alignment without evidence of immediate complication. Electronically Signed   By: Maurine Simmering M.D.   On: 03/11/2022 16:31   DG C-Arm 1-60 Min-No Report  Result Date: 03/11/2022 Fluoroscopy was utilized by the requesting physician.  No radiographic interpretation.   DG C-Arm 1-60 Min-No Report  Result Date: 03/11/2022 Fluoroscopy was utilized by the requesting physician.  No radiographic interpretation.   CT CARDIAC SCORING ([facility] LOCATIONS  ONLY)  Result Date: 03/06/2022 CLINICAL DATA:  62 year old white male * Tracking Code: [place] * EXAM: CT CARDIAC CORONARY ARTERY CALCIUM SCORE TECHNIQUE: Non-contrast imaging through the heart was performed using prospective ECG gating. Image post processing was performed on an independent workstation, allowing for quantitative analysis of the heart and coronary arteries. Note that this exam targets the heart and the chest was not imaged in its entirety. COMPARISON:  None available. FINDINGS: CORONARY CALCIUM SCORES: Left Main: 5.3 LAD: 5.2 LCx: 0 RCA: 18.9 Total Agatston Score: 29.4 MESA database percentile: 77 AORTA MEASUREMENTS: Ascending Aorta: 4.4 cm Descending Aorta:3.0 cm OTHER FINDINGS: Vascular: Normal heart size. No pericardial effusion. Dilated ascending thoracic aorta, measuring up to 4.4 cm. Mild calcified plaque of the thoracic aorta. Mediastinum/Nodes: Esophagus is unremarkable. No pathologically enlarged lymph nodes seen in the chest. Lungs/Pleura: Central airways are patent. No consolidation, pleural effusion or pneumothorax. Small solid pulmonary nodules, largest is a 5 mm nodule of the right lower lobe located on series 9, image 54. Upper Abdomen: No acute abnormality. Musculoskeletal: No chest wall mass or suspicious bone lesions identified. IMPRESSION: 1. Total calcium score of 29.4 is at percentile 77 for subjects of the same age, gender, and race/ethnicity. 2. Dilated ascending thoracic aorta, measuring up to 4.4 cm. Recommend annual imaging followup by CTA or MRA. This recommendation follows 2010 ACCF/AHA/AATS/ACR/ASA/SCA/SCAI/SIR/STS/SVM Guidelines for the Diagnosis and Management of Patients with Thoracic Aortic Disease. Circulation. 2010; 121: J884-Z660. Aortic aneurysm NOS (ICD10-I71.9) 3. Small solid pulmonary nodules, largest is a 5 mm nodule of the right lower lobe. No follow-up needed if patient is low-risk (and has no known or suspected primary neoplasm). Non-contrast chest CT can be  considered in 12 months if patient is high-risk. This recommendation follows the consensus statement: Guidelines for Management of Incidental Pulmonary Nodules Detected on CT Images: From the Fleischner Society 2017; Radiology 2017; 284:228-243. 4. Aortic Atherosclerosis (ICD10-I70.0). Electronically Signed   By: Yetta Glassman M.D.   On: 03/06/2022 16:41   CT KNEE LEFT WO CONTRAST  Result Date: 03/06/2022 CLINICAL DATA:  Left knee pain EXAM: CT OF THE LEFT KNEE WITHOUT CONTRAST TECHNIQUE: Multidetector CT imaging of the left knee was performed according to the standard protocol. Multiplanar CT image reconstructions were also generated. RADIATION DOSE REDUCTION: This exam was performed according to the departmental dose-optimization program which includes automated exposure control, adjustment of the mA and/or kV according to patient size and/or use of iterative reconstruction technique. COMPARISON:  None Available. FINDINGS: Bones/Joint/Cartilage Previous intramedullary nail fixation of the tibia. Intact hardware without evidence of loosening. Comminuted, severely depressed medial tibial plateau fracture, with up to 2.0 cm articular surface depression, and involvement of the anterior aspect of the intercondylar eminence adjacent to the tip of the tibial nail. Probable medial meniscus  tear, poorly visualized by CT. There is an oblique linear component extending across the proximal posterior aspect of the tibial metaphysis, extending to the proximal tibiofibular joint, which is nondisplaced (series 7, images 54, 57, 58, and sagittal image 60). There is a nondisplaced fracture at the base of the fibular head styloid process (sagittal image 68). There is an avulsion fracture of the anterolateral tibial plateau consistent with a Segond fracture (series 7, image 49). No articular surface depression of the lateral tibial plateau. There is an avulsion fracture at the posterior lateral aspect of the lateral femoral  condyle, near the popliteus tendon/LCL origin. There is a moderate-sized lipohemarthrosis. Ligaments Suboptimally assessed by CT. Segond fracture noted above has a high association with cruciate ligament injury. Muscles and Tendons There is diffuse muscle atrophy. Soft tissues Extensive soft tissue swelling along the knee with areas of hyperdense fluid along the superficial posterior knee (series 5, image 14 and proximal lower leg (series 5, image 134). IMPRESSION: Comminuted and severely depressed medial tibial plateau fracture, with up to 2.0 cm articular surface depression, and involvement of the anterior aspect of the intercondylar eminence adjacent to the tip of the tibial nail. Intramedullary nail appears intact without loosening. Nondisplaced oblique linear component extending across the proximal posterior aspect of the tibial metaphysis to the proximal tibiofibular joint. Nondisplaced fracture through the base of the proximal fibular head styloid process, avulsion fracture at the posterior lateral aspect of the lateral femoral condyle, and avulsion fracture of the anterior lateral tibial plateau (Segond fracture), all of which are suggestive of ligamentous injury. Moderate-sized lipohemarthrosis. Extensive soft tissue swelling along the knee with areas of hyperdense fluid along the superficial posterior knee and proximal lower leg, likely blood products/hematoma. Electronically Signed   By: Caprice Renshaw M.D.   On: 03/06/2022 11:39    EKG: Orders placed or performed during the hospital encounter of 03/11/22   EKG 12-Lead   EKG 12-Lead     Hospital Course: Albert Nunez is a 62 y.o. who was admitted to Hospital. They were brought to the operating room on 03/11/2022 and underwent Procedure(s): OPEN REDUCTION INTERNAL FIXATION (ORIF) TIBIAL PLATEAU HARDWARE REMOVAL LEFT LEG.  Patient tolerated the procedure well and was later transferred to the recovery room and then to the orthopaedic floor for  postoperative care.  They were given PO and IV analgesics for pain control following their surgery.  They were given 24 hours of postoperative antibiotics of  Anti-infectives (From admission, onward)    Start     Dose/Rate Route Frequency Ordered Stop   03/11/22 2030  ceFAZolin (ANCEF) IVPB 1 g/50 mL premix        1 g 100 mL/hr over 30 Minutes Intravenous Every 6 hours 03/11/22 1823 03/12/22 0904   03/11/22 1449  vancomycin (VANCOCIN) powder  Status:  Discontinued          As needed 03/11/22 1449 03/11/22 1645   03/11/22 1115  ceFAZolin (ANCEF) IVPB 2g/100 mL premix        2 g 200 mL/hr over 30 Minutes Intravenous On call to O.R. 03/11/22 1112 03/11/22 1430   03/11/22 1115  ceFAZolin (ANCEF) 2-4 GM/100ML-% IVPB       Note to Pharmacy: Jamelle Rushing, GRETA: cabinet override      03/11/22 1115 03/11/22 1431      and started on DVT prophylaxis in the form of Aspirin.   PT and OT were ordered for total joint protocol.  Discharge planning consulted to help with postop  disposition and equipment needs.  Patient had an Uneventful night on the evening of surgery.  They started to get up OOB with therapy on day one.   Continued to work with therapy into day two. the patient had progressed with therapy and meeting their goals.  Patient  was ready to go home.   Diet: Regular diet Activity:NWB x 6 weeks LLE ok for all ROM.  Follow-up:in 2 weeks Disposition - Home Discharged Condition: good   Discharge Instructions     Call MD / Call 911   Complete by: As directed    If you experience chest pain or shortness of breath, CALL 911 and be transported to the hospital emergency room.  If you develope a fever above 101 F, pus (white drainage) or increased drainage or redness at the wound, or calf pain, call your surgeon's office.   Constipation Prevention   Complete by: As directed    Drink plenty of fluids.  Prune juice may be helpful.  You may use a stool softener, such as Colace (over the counter) 100 mg  twice a day.  Use MiraLax (over the counter) for constipation as needed.   Diet - low sodium heart healthy   Complete by: As directed    Increase activity slowly as tolerated   Complete by: As directed    Post-operative opioid taper instructions:   Complete by: As directed    POST-OPERATIVE OPIOID TAPER INSTRUCTIONS: It is important to wean off of your opioid medication as soon as possible. If you do not need pain medication after your surgery it is ok to stop day one. Opioids include: Codeine, Hydrocodone(Norco, Vicodin), Oxycodone(Percocet, oxycontin) and hydromorphone amongst others.  Long term and even short term use of opiods can cause: Increased pain response Dependence Constipation Depression Respiratory depression And more.  Withdrawal symptoms can include Flu like symptoms Nausea, vomiting And more Techniques to manage these symptoms Hydrate well Eat regular healthy meals Stay active Use relaxation techniques(deep breathing, meditating, yoga) Do Not substitute Alcohol to help with tapering If you have been on opioids for less than two weeks and do not have pain than it is ok to stop all together.  Plan to wean off of opioids This plan should start within one week post op of your joint replacement. Maintain the same interval or time between taking each dose and first decrease the dose.  Cut the total daily intake of opioids by one tablet each day Next start to increase the time between doses. The last dose that should be eliminated is the evening dose.         Allergies as of 03/13/2022   No Known Allergies      Medication List     TAKE these medications    amLODipine 10 MG tablet Commonly known as: NORVASC Take 10 mg by mouth in the morning.   aspirin EC 81 MG tablet Take 81 mg by mouth in the morning.   losartan 100 MG tablet Commonly known as: COZAAR Take 100 mg by mouth in the morning.   oxyCODONE 5 MG immediate release tablet Commonly known  as: Oxy IR/ROXICODONE Take 1 tablet (5 mg total) by mouth every 4 (four) hours as needed for severe pain (pain.). What changed: reasons to take this               Durable Medical Equipment  (From admission, onward)           Start     Ordered  03/12/22 1415  For home use only DME standard manual wheelchair with seat cushion  Once       Comments: Patient suffers from Closed bicondylar fracture of left tibial plateau which impairs their ability to perform daily activities like ambulating  in the home.  A cane  will not resolve issue with performing activities of daily living. A wheelchair will allow patient to safely perform daily activities. Patient can safely propel the wheelchair in the home or has a caregiver who can provide assistance. Length of need lifetime . Accessories: elevating leg rests (ELRs), wheel locks, extensions and anti-tippers.   Seat and back cushions   (with L elevating leg rest)   03/12/22 1416            Follow-up Information     Yolonda Kida, MD Follow up in 2 week(s).   Specialty: Orthopedic Surgery Why: For wound re-check Contact information: [address] [place] [address] [phone]         [facility] Follow up.   Specialty: Home Health Services Contact information: [address] [place] [address] [phone]         [facility] Follow up.   Why: [employer] , company wheel chair was ordered with Contact information: [address] [place] [address] [phone]                 Signed: Dion Saucier PA-C  Orthopaedic Surgery 03/13/2022, 11:14 AM

## 2022-03-13 NOTE — Progress Notes (Signed)
Pt ready to DC. Pt has all equipment-wheelchair and pressure redistribution pad for home. Pt has walker as PTA. Reviewed and copy given of all DC instructions and follow up appts. Pt understands what to call the MD for. Pt is to stay with sister following DC. No further questions about home self care.

## 2022-03-14 ENCOUNTER — Encounter (HOSPITAL_COMMUNITY): Payer: Self-pay | Admitting: Orthopedic Surgery

## 2022-03-15 DIAGNOSIS — G629 Polyneuropathy, unspecified: Secondary | ICD-10-CM | POA: Diagnosis not present

## 2022-03-15 DIAGNOSIS — M21371 Foot drop, right foot: Secondary | ICD-10-CM | POA: Diagnosis not present

## 2022-03-15 DIAGNOSIS — I700 Atherosclerosis of aorta: Secondary | ICD-10-CM | POA: Diagnosis not present

## 2022-03-15 DIAGNOSIS — S3219XS Other fracture of sacrum, sequela: Secondary | ICD-10-CM | POA: Diagnosis not present

## 2022-03-15 DIAGNOSIS — S82142D Displaced bicondylar fracture of left tibia, subsequent encounter for closed fracture with routine healing: Secondary | ICD-10-CM | POA: Diagnosis not present

## 2022-03-15 DIAGNOSIS — I10 Essential (primary) hypertension: Secondary | ICD-10-CM | POA: Diagnosis not present

## 2022-03-15 DIAGNOSIS — E785 Hyperlipidemia, unspecified: Secondary | ICD-10-CM | POA: Diagnosis not present

## 2022-03-15 DIAGNOSIS — G709 Myoneural disorder, unspecified: Secondary | ICD-10-CM | POA: Diagnosis not present

## 2022-03-15 DIAGNOSIS — I77810 Thoracic aortic ectasia: Secondary | ICD-10-CM | POA: Diagnosis not present

## 2022-03-18 DIAGNOSIS — I10 Essential (primary) hypertension: Secondary | ICD-10-CM | POA: Diagnosis not present

## 2022-03-18 DIAGNOSIS — S3219XS Other fracture of sacrum, sequela: Secondary | ICD-10-CM | POA: Diagnosis not present

## 2022-03-18 DIAGNOSIS — I77810 Thoracic aortic ectasia: Secondary | ICD-10-CM | POA: Diagnosis not present

## 2022-03-18 DIAGNOSIS — M21371 Foot drop, right foot: Secondary | ICD-10-CM | POA: Diagnosis not present

## 2022-03-18 DIAGNOSIS — I700 Atherosclerosis of aorta: Secondary | ICD-10-CM | POA: Diagnosis not present

## 2022-03-18 DIAGNOSIS — E785 Hyperlipidemia, unspecified: Secondary | ICD-10-CM | POA: Diagnosis not present

## 2022-03-18 DIAGNOSIS — S82142D Displaced bicondylar fracture of left tibia, subsequent encounter for closed fracture with routine healing: Secondary | ICD-10-CM | POA: Diagnosis not present

## 2022-03-18 DIAGNOSIS — G709 Myoneural disorder, unspecified: Secondary | ICD-10-CM | POA: Diagnosis not present

## 2022-03-18 DIAGNOSIS — G629 Polyneuropathy, unspecified: Secondary | ICD-10-CM | POA: Diagnosis not present

## 2022-03-27 DIAGNOSIS — Z4789 Encounter for other orthopedic aftercare: Secondary | ICD-10-CM | POA: Diagnosis not present

## 2022-04-08 DIAGNOSIS — M25562 Pain in left knee: Secondary | ICD-10-CM | POA: Diagnosis not present

## 2022-04-15 DIAGNOSIS — R1013 Epigastric pain: Secondary | ICD-10-CM | POA: Diagnosis not present

## 2022-04-16 ENCOUNTER — Other Ambulatory Visit: Payer: Self-pay | Admitting: Physician Assistant

## 2022-04-16 ENCOUNTER — Ambulatory Visit
Admission: RE | Admit: 2022-04-16 | Discharge: 2022-04-16 | Disposition: A | Discharge status: Home / Self Care | Payer: Medicare HMO | Source: Ambulatory Visit | Attending: Physician Assistant | Admitting: Physician Assistant

## 2022-04-16 DIAGNOSIS — R748 Abnormal levels of other serum enzymes: Secondary | ICD-10-CM

## 2022-04-16 DIAGNOSIS — M25562 Pain in left knee: Secondary | ICD-10-CM | POA: Diagnosis not present

## 2022-04-16 DIAGNOSIS — R945 Abnormal results of liver function studies: Secondary | ICD-10-CM | POA: Diagnosis not present

## 2022-04-18 DIAGNOSIS — M25562 Pain in left knee: Secondary | ICD-10-CM | POA: Diagnosis not present

## 2022-04-23 DIAGNOSIS — M25562 Pain in left knee: Secondary | ICD-10-CM | POA: Diagnosis not present

## 2022-04-24 DIAGNOSIS — Z4789 Encounter for other orthopedic aftercare: Secondary | ICD-10-CM | POA: Diagnosis not present

## 2022-04-24 DIAGNOSIS — R748 Abnormal levels of other serum enzymes: Secondary | ICD-10-CM | POA: Diagnosis not present

## 2022-04-24 DIAGNOSIS — R945 Abnormal results of liver function studies: Secondary | ICD-10-CM | POA: Diagnosis not present

## 2022-04-25 DIAGNOSIS — M25562 Pain in left knee: Secondary | ICD-10-CM | POA: Diagnosis not present

## 2022-04-30 DIAGNOSIS — M25562 Pain in left knee: Secondary | ICD-10-CM | POA: Diagnosis not present

## 2022-05-03 DIAGNOSIS — M25562 Pain in left knee: Secondary | ICD-10-CM | POA: Diagnosis not present

## 2022-05-07 DIAGNOSIS — M25562 Pain in left knee: Secondary | ICD-10-CM | POA: Diagnosis not present

## 2022-05-10 DIAGNOSIS — M25562 Pain in left knee: Secondary | ICD-10-CM | POA: Diagnosis not present

## 2022-05-12 DIAGNOSIS — R8290 Unspecified abnormal findings in urine: Secondary | ICD-10-CM | POA: Diagnosis not present

## 2022-05-12 DIAGNOSIS — N3000 Acute cystitis without hematuria: Secondary | ICD-10-CM | POA: Diagnosis not present

## 2022-05-14 DIAGNOSIS — M25562 Pain in left knee: Secondary | ICD-10-CM | POA: Diagnosis not present

## 2022-05-17 DIAGNOSIS — M25562 Pain in left knee: Secondary | ICD-10-CM | POA: Diagnosis not present

## 2022-05-21 DIAGNOSIS — M25562 Pain in left knee: Secondary | ICD-10-CM | POA: Diagnosis not present

## 2022-05-23 DIAGNOSIS — M25562 Pain in left knee: Secondary | ICD-10-CM | POA: Diagnosis not present

## 2022-05-28 DIAGNOSIS — M25562 Pain in left knee: Secondary | ICD-10-CM | POA: Diagnosis not present

## 2022-05-30 DIAGNOSIS — M25562 Pain in left knee: Secondary | ICD-10-CM | POA: Diagnosis not present

## 2022-06-04 DIAGNOSIS — M25562 Pain in left knee: Secondary | ICD-10-CM | POA: Diagnosis not present

## 2022-06-06 DIAGNOSIS — M25562 Pain in left knee: Secondary | ICD-10-CM | POA: Diagnosis not present

## 2022-06-11 DIAGNOSIS — M25562 Pain in left knee: Secondary | ICD-10-CM | POA: Diagnosis not present

## 2022-06-13 DIAGNOSIS — M25562 Pain in left knee: Secondary | ICD-10-CM | POA: Diagnosis not present

## 2023-02-03 ENCOUNTER — Ambulatory Visit: Payer: Medicare HMO | Admitting: Physician Assistant

## 2023-02-05 ENCOUNTER — Other Ambulatory Visit: Payer: Self-pay | Admitting: Family Medicine

## 2023-02-05 DIAGNOSIS — I77810 Thoracic aortic ectasia: Secondary | ICD-10-CM

## 2023-02-11 ENCOUNTER — Ambulatory Visit (HOSPITAL_BASED_OUTPATIENT_CLINIC_OR_DEPARTMENT_OTHER): Payer: Medicare HMO | Admitting: General Surgery

## 2023-02-26 ENCOUNTER — Ambulatory Visit
Admission: RE | Admit: 2023-02-26 | Discharge: 2023-02-26 | Disposition: A | Discharge status: Home / Self Care | Payer: Medicare HMO | Source: Ambulatory Visit | Attending: Family Medicine | Admitting: Family Medicine

## 2023-02-26 DIAGNOSIS — I7121 Aneurysm of the ascending aorta, without rupture: Secondary | ICD-10-CM | POA: Diagnosis not present

## 2023-02-26 DIAGNOSIS — L89306 Pressure-induced deep tissue damage of unspecified buttock: Secondary | ICD-10-CM | POA: Diagnosis not present

## 2023-02-26 DIAGNOSIS — L89326 Pressure-induced deep tissue damage of left buttock: Secondary | ICD-10-CM | POA: Diagnosis not present

## 2023-02-26 DIAGNOSIS — I700 Atherosclerosis of aorta: Secondary | ICD-10-CM | POA: Diagnosis not present

## 2023-02-26 DIAGNOSIS — L89314 Pressure ulcer of right buttock, stage 4: Secondary | ICD-10-CM | POA: Diagnosis not present

## 2023-02-26 DIAGNOSIS — I77810 Thoracic aortic ectasia: Secondary | ICD-10-CM

## 2023-02-26 MED ORDER — IOPAMIDOL (ISOVUE-370) INJECTION 76%
75.0000 mL | Freq: Once | INTRAVENOUS | Status: AC | PRN
  Administered 2023-02-26: 75 mL via INTRAVENOUS

## 2023-03-05 DIAGNOSIS — L89306 Pressure-induced deep tissue damage of unspecified buttock: Secondary | ICD-10-CM | POA: Diagnosis not present

## 2023-03-05 DIAGNOSIS — L89314 Pressure ulcer of right buttock, stage 4: Secondary | ICD-10-CM | POA: Diagnosis not present

## 2023-03-05 DIAGNOSIS — L89326 Pressure-induced deep tissue damage of left buttock: Secondary | ICD-10-CM | POA: Diagnosis not present

## 2023-03-12 DIAGNOSIS — L89306 Pressure-induced deep tissue damage of unspecified buttock: Secondary | ICD-10-CM | POA: Diagnosis not present

## 2023-03-12 DIAGNOSIS — L89314 Pressure ulcer of right buttock, stage 4: Secondary | ICD-10-CM | POA: Diagnosis not present

## 2023-03-12 DIAGNOSIS — L89326 Pressure-induced deep tissue damage of left buttock: Secondary | ICD-10-CM | POA: Diagnosis not present

## 2023-03-19 DIAGNOSIS — L89306 Pressure-induced deep tissue damage of unspecified buttock: Secondary | ICD-10-CM | POA: Diagnosis not present

## 2023-03-19 DIAGNOSIS — L89314 Pressure ulcer of right buttock, stage 4: Secondary | ICD-10-CM | POA: Diagnosis not present

## 2023-03-19 DIAGNOSIS — L89326 Pressure-induced deep tissue damage of left buttock: Secondary | ICD-10-CM | POA: Diagnosis not present

## 2023-03-21 DIAGNOSIS — M1712 Unilateral primary osteoarthritis, left knee: Secondary | ICD-10-CM | POA: Diagnosis not present

## 2023-03-26 DIAGNOSIS — L89314 Pressure ulcer of right buttock, stage 4: Secondary | ICD-10-CM | POA: Diagnosis not present

## 2023-04-02 DIAGNOSIS — L89314 Pressure ulcer of right buttock, stage 4: Secondary | ICD-10-CM | POA: Diagnosis not present

## 2023-04-16 DIAGNOSIS — L89306 Pressure-induced deep tissue damage of unspecified buttock: Secondary | ICD-10-CM | POA: Diagnosis not present

## 2023-04-16 DIAGNOSIS — L89314 Pressure ulcer of right buttock, stage 4: Secondary | ICD-10-CM | POA: Diagnosis not present

## 2023-06-23 DIAGNOSIS — M1732 Unilateral post-traumatic osteoarthritis, left knee: Secondary | ICD-10-CM | POA: Diagnosis not present

## 2023-07-12 DIAGNOSIS — I10 Essential (primary) hypertension: Secondary | ICD-10-CM | POA: Diagnosis not present

## 2023-07-15 DIAGNOSIS — M25462 Effusion, left knee: Secondary | ICD-10-CM | POA: Diagnosis not present

## 2023-07-16 DIAGNOSIS — I10 Essential (primary) hypertension: Secondary | ICD-10-CM | POA: Diagnosis not present

## 2023-07-19 DIAGNOSIS — I10 Essential (primary) hypertension: Secondary | ICD-10-CM | POA: Diagnosis not present

## 2023-07-19 DIAGNOSIS — E782 Mixed hyperlipidemia: Secondary | ICD-10-CM | POA: Diagnosis not present

## 2023-07-19 DIAGNOSIS — E7800 Pure hypercholesterolemia, unspecified: Secondary | ICD-10-CM | POA: Diagnosis not present

## 2023-08-10 DIAGNOSIS — I10 Essential (primary) hypertension: Secondary | ICD-10-CM | POA: Diagnosis not present

## 2023-08-18 DIAGNOSIS — I10 Essential (primary) hypertension: Secondary | ICD-10-CM | POA: Diagnosis not present

## 2023-08-18 DIAGNOSIS — E7800 Pure hypercholesterolemia, unspecified: Secondary | ICD-10-CM | POA: Diagnosis not present

## 2023-08-18 DIAGNOSIS — E782 Mixed hyperlipidemia: Secondary | ICD-10-CM | POA: Diagnosis not present

## 2023-09-09 DIAGNOSIS — I10 Essential (primary) hypertension: Secondary | ICD-10-CM | POA: Diagnosis not present

## 2023-09-16 DIAGNOSIS — G839 Paralytic syndrome, unspecified: Secondary | ICD-10-CM | POA: Diagnosis not present

## 2023-09-16 DIAGNOSIS — M21372 Foot drop, left foot: Secondary | ICD-10-CM | POA: Diagnosis not present

## 2023-09-18 DIAGNOSIS — E782 Mixed hyperlipidemia: Secondary | ICD-10-CM | POA: Diagnosis not present

## 2023-09-18 DIAGNOSIS — E7800 Pure hypercholesterolemia, unspecified: Secondary | ICD-10-CM | POA: Diagnosis not present

## 2023-09-18 DIAGNOSIS — I10 Essential (primary) hypertension: Secondary | ICD-10-CM | POA: Diagnosis not present

## 2023-10-09 DIAGNOSIS — I10 Essential (primary) hypertension: Secondary | ICD-10-CM | POA: Diagnosis not present

## 2023-10-14 DIAGNOSIS — M1712 Unilateral primary osteoarthritis, left knee: Secondary | ICD-10-CM | POA: Diagnosis not present

## 2023-10-19 DIAGNOSIS — E7800 Pure hypercholesterolemia, unspecified: Secondary | ICD-10-CM | POA: Diagnosis not present

## 2023-10-19 DIAGNOSIS — E782 Mixed hyperlipidemia: Secondary | ICD-10-CM | POA: Diagnosis not present

## 2023-10-19 DIAGNOSIS — I10 Essential (primary) hypertension: Secondary | ICD-10-CM | POA: Diagnosis not present

## 2023-11-08 DIAGNOSIS — I10 Essential (primary) hypertension: Secondary | ICD-10-CM | POA: Diagnosis not present

## 2023-11-18 DIAGNOSIS — I10 Essential (primary) hypertension: Secondary | ICD-10-CM | POA: Diagnosis not present

## 2023-11-18 DIAGNOSIS — E782 Mixed hyperlipidemia: Secondary | ICD-10-CM | POA: Diagnosis not present

## 2023-11-18 DIAGNOSIS — E7800 Pure hypercholesterolemia, unspecified: Secondary | ICD-10-CM | POA: Diagnosis not present

## 2023-11-27 DIAGNOSIS — M21372 Foot drop, left foot: Secondary | ICD-10-CM | POA: Diagnosis not present

## 2023-12-08 DIAGNOSIS — I10 Essential (primary) hypertension: Secondary | ICD-10-CM | POA: Diagnosis not present

## 2023-12-19 DIAGNOSIS — E782 Mixed hyperlipidemia: Secondary | ICD-10-CM | POA: Diagnosis not present

## 2023-12-19 DIAGNOSIS — E7800 Pure hypercholesterolemia, unspecified: Secondary | ICD-10-CM | POA: Diagnosis not present

## 2023-12-19 DIAGNOSIS — I10 Essential (primary) hypertension: Secondary | ICD-10-CM | POA: Diagnosis not present

## 2023-12-29 DIAGNOSIS — N390 Urinary tract infection, site not specified: Secondary | ICD-10-CM | POA: Diagnosis not present

## 2024-01-07 DIAGNOSIS — I10 Essential (primary) hypertension: Secondary | ICD-10-CM | POA: Diagnosis not present

## 2024-01-18 DIAGNOSIS — E782 Mixed hyperlipidemia: Secondary | ICD-10-CM | POA: Diagnosis not present

## 2024-01-18 DIAGNOSIS — I10 Essential (primary) hypertension: Secondary | ICD-10-CM | POA: Diagnosis not present

## 2024-01-18 DIAGNOSIS — E7800 Pure hypercholesterolemia, unspecified: Secondary | ICD-10-CM | POA: Diagnosis not present

## 2024-01-20 DIAGNOSIS — M7052 Other bursitis of knee, left knee: Secondary | ICD-10-CM | POA: Diagnosis not present

## 2024-01-20 DIAGNOSIS — M1732 Unilateral post-traumatic osteoarthritis, left knee: Secondary | ICD-10-CM | POA: Diagnosis not present

## 2024-01-22 DIAGNOSIS — G839 Paralytic syndrome, unspecified: Secondary | ICD-10-CM | POA: Diagnosis not present

## 2024-01-22 DIAGNOSIS — N319 Neuromuscular dysfunction of bladder, unspecified: Secondary | ICD-10-CM | POA: Diagnosis not present

## 2024-01-22 DIAGNOSIS — Z95828 Presence of other vascular implants and grafts: Secondary | ICD-10-CM | POA: Diagnosis not present

## 2024-01-22 DIAGNOSIS — Z125 Encounter for screening for malignant neoplasm of prostate: Secondary | ICD-10-CM | POA: Diagnosis not present

## 2024-01-22 DIAGNOSIS — G720 Drug-induced myopathy: Secondary | ICD-10-CM | POA: Diagnosis not present

## 2024-01-22 DIAGNOSIS — I7121 Aneurysm of the ascending aorta, without rupture: Secondary | ICD-10-CM | POA: Diagnosis not present

## 2024-01-22 DIAGNOSIS — I10 Essential (primary) hypertension: Secondary | ICD-10-CM | POA: Diagnosis not present

## 2024-01-22 DIAGNOSIS — E782 Mixed hyperlipidemia: Secondary | ICD-10-CM | POA: Diagnosis not present

## 2024-01-22 DIAGNOSIS — Z0000 Encounter for general adult medical examination without abnormal findings: Secondary | ICD-10-CM | POA: Diagnosis not present

## 2024-01-26 ENCOUNTER — Other Ambulatory Visit: Payer: Self-pay | Admitting: Family Medicine

## 2024-01-26 DIAGNOSIS — N319 Neuromuscular dysfunction of bladder, unspecified: Secondary | ICD-10-CM

## 2024-01-30 ENCOUNTER — Inpatient Hospital Stay: Admission: RE | Admit: 2024-01-30 | Discharge: 2024-01-30 | Attending: Family Medicine | Admitting: Family Medicine

## 2024-01-30 DIAGNOSIS — N319 Neuromuscular dysfunction of bladder, unspecified: Secondary | ICD-10-CM

## 2024-01-30 MED ORDER — IOPAMIDOL (ISOVUE-370) INJECTION 76%
100.0000 mL | Freq: Once | INTRAVENOUS | Status: AC | PRN
  Administered 2024-01-30: 100 mL via INTRAVENOUS
